# Patient Record
Sex: Female | Born: 2000 | Race: Black or African American | Hispanic: No | Marital: Single | Smoking: Current some day smoker
Health system: Southern US, Community
[De-identification: ages and names within clinical notes are randomized; demographics above are authoritative.]

## PROBLEM LIST (undated history)

## (undated) DIAGNOSIS — F419 Anxiety disorder, unspecified: Secondary | ICD-10-CM

## (undated) DIAGNOSIS — E119 Type 2 diabetes mellitus without complications: Secondary | ICD-10-CM

## (undated) DIAGNOSIS — D649 Anemia, unspecified: Secondary | ICD-10-CM

---
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
## 2021-05-04 ENCOUNTER — Other Ambulatory Visit: Payer: Self-pay

## 2021-05-04 ENCOUNTER — Encounter (HOSPITAL_COMMUNITY): Payer: Self-pay

## 2021-05-04 ENCOUNTER — Emergency Department (HOSPITAL_COMMUNITY)
Admission: EM | Admit: 2021-05-04 | Discharge: 2021-05-04 | Disposition: A | Discharge status: Home / Self Care | Payer: Medicaid Other | Attending: Emergency Medicine | Admitting: Emergency Medicine

## 2021-05-04 DIAGNOSIS — S60811A Abrasion of right wrist, initial encounter: Secondary | ICD-10-CM | POA: Insufficient documentation

## 2021-05-04 DIAGNOSIS — S61531A Puncture wound without foreign body of right wrist, initial encounter: Secondary | ICD-10-CM | POA: Insufficient documentation

## 2021-05-04 DIAGNOSIS — S6991XA Unspecified injury of right wrist, hand and finger(s), initial encounter: Secondary | ICD-10-CM | POA: Diagnosis present

## 2021-05-04 DIAGNOSIS — W540XXA Bitten by dog, initial encounter: Secondary | ICD-10-CM | POA: Insufficient documentation

## 2021-05-04 MED ORDER — AMOXICILLIN-POT CLAVULANATE 875-125 MG PO TABS: 1.0000 | ORAL_TABLET | Freq: Two times a day (BID) | ORAL | 0 refills | Status: DC

## 2021-05-04 MED ORDER — AMOXICILLIN-POT CLAVULANATE 875-125 MG PO TABS
1.0000 | ORAL_TABLET | Freq: Once | ORAL | Status: AC
  Administered 2021-05-04: 1 via ORAL
  Filled 2021-05-04: qty 1

## 2021-05-04 NOTE — Discharge Instructions (Signed)
Keep the wounds clean with antibacterial soap and water.  Keep the areas bandaged as needed until they heal.  Take the antibiotic as directed until finished.  Someone from animal control should contact you to take a report.  Return to the emergency department for any worsening symptoms or signs of infection.  Your wrist may be sore for several days, recommend you take ibuprofen every 6 hours as needed for pain.
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
## 2021-05-04 NOTE — ED Triage Notes (Signed)
Patient with complaints of dog bite to right wrist forearm area. Small abrasion noted no active bleeding. Pain only when touching the area. Family dog without shots per patient.

## 2021-05-04 NOTE — ED Notes (Signed)
Animal control contacted and given information.

## 2021-05-04 NOTE — ED Provider Notes (Signed)
[facility] EMERGENCY DEPARTMENT Provider Note   CSN: [id number] Arrival date & time: 05/04/21  5462     History  Chief Complaint  Patient presents with   Animal Bite    Marisa Long is a 21 y.o. female.   Animal Bite Associated symptoms: no numbness        Marisa Long is a 21 y.o. female who presents to the Emergency Department complaining of dog bite of her right wrist.  She states that she was involved in an argument with her mother and brother or owners of the dog.  States that her brother told the dog to attack her.  She states the dog grabbed her right wrist.  She complains of pain to her wrist with movement, and abrasions and scratches of her wrist.  The dog may have also scratched her left thigh, but she states there are no marks or open wounds of her leg.  Patient states the dog is not up-to-date on its rabies vaccinations, but states the dog stays inside the home.  She denies any pain or tingling of her fingers.  Believes her Td is up-to-date.    Home Medications Prior to Admission medications   Medication Sig Start Date End Date Taking? Authorizing Provider  acetaminophen (TYLENOL) 325 MG tablet Take 1-2 tablets (325-650 mg total) by mouth every 6 (six) hours as needed for mild pain (pain score 1-3 or temp > 100.5). Patient not taking: Reported on 05/04/2021 02/09/19   Derenda Fennel, PA  HYDROcodone-acetaminophen (NORCO/VICODIN) 5-325 MG tablet Take 1-2 tablets by mouth every 6 (six) hours as needed for severe pain. Patient not taking: Reported on 05/04/2021 02/09/19   Edmisten, Lyn Hollingshead, PA  methocarbamol (ROBAXIN) 500 MG tablet Take 1 tablet (500 mg total) by mouth every 6 (six) hours as needed for muscle spasms. Patient not taking: Reported on 05/04/2021 02/09/19   Derenda Fennel, PA      Allergies    Patient has no known allergies.    Review of Systems   Review of Systems  Musculoskeletal:  Negative for joint swelling.  Skin:  Positive for  wound.       Dog bite right wrist  Neurological:  Negative for weakness and numbness.  All other systems reviewed and are negative.  Physical Exam Updated Vital Signs BP 114/77 (BP Location: Left Arm)    Pulse 72    Temp 98.1 F (36.7 C) (Oral)    Resp 18    Ht 5\' 2"  (1.575 m)    Wt 64.9 kg    LMP 03/27/2021 (Approximate)    SpO2 100%    BMI 26.16 kg/m  Physical Exam Vitals and nursing note reviewed.  Constitutional:      General: She is not in acute distress.    Appearance: Normal appearance. She is not toxic-appearing.  Cardiovascular:     Rate and Rhythm: Normal rate and regular rhythm.     Pulses: Normal pulses.  Pulmonary:     Effort: Pulmonary effort is normal.     Breath sounds: Normal breath sounds.  Musculoskeletal:        General: Tenderness and signs of injury present.     Right wrist: Tenderness present. No swelling, bony tenderness or snuff box tenderness. Normal range of motion.     Comments: 1 superficial appearing puncture wound of the distal right wrist.  No active bleeding.  Several abrasions and scratches of the right wrist.  Patient has full range of motion of the  wrist and fingers of the right hand without difficulty.  Skin:    General: Skin is warm.     Capillary Refill: Capillary refill takes less than 2 seconds.  Neurological:     General: No focal deficit present.     Mental Status: She is alert.     Sensory: No sensory deficit.     Motor: No weakness.    ED Results / Procedures / Treatments   Labs (all labs ordered are listed, but only abnormal results are displayed) Labs Reviewed - No data to display  EKG None  Radiology No results found.  Procedures Procedures    Medications Ordered in ED Medications  amoxicillin-clavulanate (AUGMENTIN) 875-125 MG per tablet 1 tablet (has no administration in time range)    ED Course/ Medical Decision Making/ A&P                           Medical Decision Making Here for evaluation of dog bite of  the right wrist.  Occurred earlier this morning.  Patient reports dog owner provoked the attack.  Patient unsure of status of vaccinations.  Risk Prescription drug management.   Incident occurred in [place].  Animal control notified and will contact patient via telephone to take report.  dog reported to stay inside the home.  Questionable vaccination status. Mostly abrasions with one puncture that appears superficial   Here, wound cleaned and bandaged.  Td is up to date and initial dose of antibiotics provided.  Will provide prescription for antibiotics as well.  Dog reported to be within the home, so I do not feel that rabies vaccinations are indicated at this time as dog can be quarantined.  Patient agreeable to wound care precautions.        Final Clinical Impression(s) / ED Diagnoses Final diagnoses:  Dog bite, initial encounter    Rx / DC Orders ED Discharge Orders     None         Pauline Aus, PA-C 05/04/21 1210    Rozelle Logan, DO 05/04/21 1530

## 2021-12-22 ENCOUNTER — Inpatient Hospital Stay (HOSPITAL_COMMUNITY)
Admission: AD | Admit: 2021-12-22 | Discharge: 2021-12-22 | Disposition: A | Discharge status: Home / Self Care | Payer: Medicaid Other | Attending: Obstetrics & Gynecology | Admitting: Obstetrics & Gynecology

## 2021-12-22 ENCOUNTER — Inpatient Hospital Stay (HOSPITAL_COMMUNITY): Payer: Medicaid Other

## 2021-12-22 ENCOUNTER — Encounter (HOSPITAL_COMMUNITY): Payer: Self-pay

## 2021-12-22 DIAGNOSIS — O209 Hemorrhage in early pregnancy, unspecified: Secondary | ICD-10-CM | POA: Diagnosis present

## 2021-12-22 DIAGNOSIS — Z3A01 Less than 8 weeks gestation of pregnancy: Secondary | ICD-10-CM | POA: Insufficient documentation

## 2021-12-22 DIAGNOSIS — O4690 Antepartum hemorrhage, unspecified, unspecified trimester: Secondary | ICD-10-CM

## 2021-12-22 DIAGNOSIS — O3680X0 Pregnancy with inconclusive fetal viability, not applicable or unspecified: Secondary | ICD-10-CM | POA: Diagnosis not present

## 2021-12-22 LAB — WET PREP, GENITAL
Sperm: NONE SEEN
Trich, Wet Prep: NONE SEEN
WBC, Wet Prep HPF POC: <10 (ref <10)
Yeast Wet Prep HPF POC: NONE SEEN

## 2021-12-22 LAB — CBC
HCT: 34.3 % — ABNORMAL LOW (ref 36.0–46.0)
Hemoglobin: 11.6 g/dL — ABNORMAL LOW (ref 12.0–15.0)
MCH: 30.9 pg (ref 26.0–34.0)
MCHC: 33.8 g/dL (ref 30.0–36.0)
MCV: 91.2 fL (ref 80.0–100.0)
Platelets: 260 10*3/uL (ref 150–400)
RBC: 3.76 MIL/uL — ABNORMAL LOW (ref 3.87–5.11)
RDW: 13.1 % (ref 11.5–15.5)
WBC: 12.2 10*3/uL — ABNORMAL HIGH (ref 4.0–10.5)
nRBC: 0 % (ref 0.0–0.2)

## 2021-12-22 LAB — HCG, QUANTITATIVE, PREGNANCY: hCG, Beta Chain, Quant, S: 44474 m[IU]/mL — ABNORMAL HIGH (ref <5)

## 2021-12-22 LAB — ABO/RH: ABO/RH(D): A POS

## 2021-12-22 LAB — POCT PREGNANCY, URINE: Preg Test, Ur: POSITIVE — AB

## 2021-12-22 NOTE — Discharge Instructions (Signed)
[place] [facility] Providers   [facility]             [address], [place], [place] [address] [phone]  [facility]                                                             [address], [place], [place], [address] [phone]  [facility]                                    [address], [place], [place], [address] [phone]  [facility] [place] [address], [place], [place], [address] [phone]  [facility]                                 [address], [place], [place], [address] [phone]  [facility]                                    [address], [place], [place], [address] [phone]  [facility] [address], [place], [place], [address]                              [facility] [address], [place], [place], [address] [phone]  [facility]         Phone: [phone]  [facility]      Phone: [phone]   [facility]      Phone: [phone]  [facility]         Phone: [phone]   [facility]    Phone: [phone]  [facility]      Phone: [phone]  [facility]     Phone: [phone]  [facility]        Phone: [phone]  [facility] (Sheronette Cousins) [phone]  [facility]      Phone: [phone]   

## 2021-12-22 NOTE — MAU Note (Signed)
.  Marisa Long is a 21 y.o. at Unknown here in MAU reporting: VB spotting with wiping started about 1800. Pt report not wearing pad cause it is only when she went to the bathroom. Pt denies cramping and LOF. Pt denies recent intercourse for last 48 hours. Pt took two at home preg test that were pos  LMP: 11/08/2021  Onset of complaint: 1800 Pain score: 0/10 Vitals:   12/22/21 2021  BP: (!) 108/51  Pulse: 77  Resp: 18  SpO2: 100%      Lab orders placed from triage:  UA, UPT

## 2021-12-23 LAB — GC/CHLAMYDIA PROBE AMP (~~LOC~~) NOT AT ARMC
Chlamydia: NEGATIVE
Comment: NEGATIVE
Comment: NORMAL
Neisseria Gonorrhea: NEGATIVE

## 2021-12-23 NOTE — MAU Provider Note (Signed)
Chief Complaint: Vaginal Bleeding   None       SUBJECTIVE HPI: Marisa Long is a 21 y.o. G1P0 at [redacted]w[redacted]d by LMP who presents to maternity admissions reporting spotting with wipinig, no further bleeding.  No cramping. She denies vaginal itching/burning, urinary symptoms, h/a, dizziness, n/v, or fever/chills.     Vaginal Bleeding The patient's primary symptoms include vaginal bleeding. The patient's pertinent negatives include no genital itching or pelvic pain. The current episode started today. The patient is experiencing no pain. Pertinent negatives include no abdominal pain, back pain, chills, fever or headaches. The vaginal discharge was bloody. The vaginal bleeding is spotting. She has not been passing clots. She has not been passing tissue. Nothing aggravates the symptoms. She has tried nothing for the symptoms.   RN Note: Marisa Long is a 21 y.o. at Unknown here in MAU reporting: VB spotting with wiping started about 1800. Pt report not wearing pad cause it is only when she went to the bathroom. Pt denies cramping and LOF. Pt denies recent intercourse for last 48 hours. Pt took two at home preg test that were pos  LMP: 11/08/2021  No past medical history on file. Past Surgical History:  Procedure Laterality Date   TIBIA IM NAIL INSERTION Right 02/07/2019   Procedure: 1.  Excisional debridement of skin, subcutaneous tissue, and bone right tibia. 2.  Intramedullary fixation of right tibial shaft fracture. 3.  Primary closure of 22 mm traumatic wound and 90 mm traumatic wound. 4.  Application of negative pressure incisional dressing. 5.  Interpretation of fluoroscopic images.;  Surgeon: Samson Frederic, MD;  [facility];  Service: Orthopedics;  Late   Social History   Socioeconomic History   Marital status: Single    Spouse name: Not on file   Number of children: Not on file   Years of education: Not on file   Highest education level: Not on file  Occupational History   Not on  file  Tobacco Use   Smoking status: Passive Smoke Exposure - Never Smoker   Smokeless tobacco: Never  Substance and Sexual Activity   Alcohol use: No   Drug use: No   Sexual activity: Not on file  Other Topics Concern   Not on file  Social History Narrative   Not on file   Social Determinants of Health   Financial Resource Strain: Not on file  Food Insecurity: Not on file  Transportation Needs: Not on file  Physical Activity: Not on file  Stress: Not on file  Social Connections: Not on file  Intimate Partner Violence: Not on file   No current facility-administered medications on file prior to encounter.   No current outpatient medications on file prior to encounter.   No Known Allergies  I have reviewed patient's Past Medical Hx, Surgical Hx, Family Hx, Social Hx, medications and allergies.   ROS:  Review of Systems  Constitutional:  Negative for chills and fever.  Gastrointestinal:  Negative for abdominal pain.  Genitourinary:  Positive for vaginal bleeding. Negative for pelvic pain.  Musculoskeletal:  Negative for back pain.  Neurological:  Negative for headaches.   Review of Systems  Other systems negative   Physical Exam  Physical Exam No data found. Constitutional: Well-developed, well-nourished female in no acute distress.  Cardiovascular: normal rate Respiratory: normal effort GI: Abd soft, non-tender.  MS: Extremities nontender, no edema, normal ROM Neurologic: Alert and oriented x 4.  GU: Neg CVAT.  PELVIC EXAM: deferred in lieu of transvaginal  US  LAB RESULTS Results for orders placed or performed during the hospital encounter of 12/22/21 (from the past 48 hour(s))  Pregnancy, urine POC     Status: Abnormal   Collection Time: 12/22/21  8:07 PM  Result Value Ref Range   Preg Test, Ur POSITIVE (A) NEGATIVE    Comment:        THE SENSITIVITY OF THIS METHODOLOGY IS >24 mIU/mL   GC/Chlamydia probe amp ([place])not at [facility]     Status: None    Collection Time: 12/22/21  8:25 PM  Result Value Ref Range   Neisseria Gonorrhea Negative    Chlamydia Negative    Comment Normal Reference Ranger Chlamydia - Negative    Comment      Normal Reference Range Neisseria Gonorrhea - Negative  CBC     Status: Abnormal   Collection Time: 12/22/21  8:36 PM  Result Value Ref Range   WBC 12.2 (H) 4.0 - 10.5 K/uL   RBC 3.76 (L) 3.87 - 5.11 MIL/uL   Hemoglobin 11.6 (L) 12.0 - 15.0 g/dL   HCT 26.8 (L) 34.1 - 96.2 %   MCV 91.2 80.0 - 100.0 fL   MCH 30.9 26.0 - 34.0 pg   MCHC 33.8 30.0 - 36.0 g/dL   RDW 22.9 79.8 - 92.1 %   Platelets 260 150 - 400 K/uL   nRBC 0.0 0.0 - 0.2 %    Comment: Performed at [facility], [address] [place], [place] [address]  hCG, quantitative, pregnancy     Status: Abnormal   Collection Time: 12/22/21  8:36 PM  Result Value Ref Range   hCG, Beta Chain, Quant, S 44,474 (H) <5 mIU/mL    Comment:          GEST. AGE      CONC.  (mIU/mL)   <=1 WEEK        5 - 50     2 WEEKS       50 - 500     3 WEEKS       100 - 10,000     4 WEEKS     1,000 - 30,000     5 WEEKS     3,500 - 115,000   6-8 WEEKS     12,000 - 270,000    12 WEEKS     15,000 - 220,000        FEMALE AND NON-PREGNANT FEMALE:     LESS THAN 5 mIU/mL Performed at [facility], [address], [place], [place] [address]   ABO/Rh     Status: None   Collection Time: 12/22/21  8:36 PM  Result Value Ref Range   ABO/RH(D) A POS    No rh immune globuloin      NOT A RH IMMUNE GLOBULIN CANDIDATE, PT RH POSITIVE Performed at [facility], [address] [place], [place] [address]   Wet prep, genital     Status: Abnormal   Collection Time: 12/22/21  9:55 PM  Result Value Ref Range   Yeast Wet Prep HPF POC NONE SEEN NONE SEEN   Trich, Wet Prep NONE SEEN NONE SEEN   Clue Cells Wet Prep HPF POC PRESENT (A) NONE SEEN   WBC, Wet Prep HPF POC <10 <10   Sperm NONE SEEN     Comment: Performed at [facility], [address]  [place], [place] [address]    --/--/A POS (10/11 2036)  IMAGING US  OB LESS THAN 14 WEEKS WITH OB TRANSVAGINAL  Result Date: 12/22/2021 CLINICAL DATA:  First-trimester pregnancy. Spotting for 3 hours. Estimated gestational age by LMP is 6 weeks 2 days. Quantitative beta HCG is in progress. EXAM: OBSTETRIC <14 WK [place] AND TRANSVAGINAL OB US TECHNIQUE: Both transabdominal and transvaginal ultrasound examinations were performed for complete evaluation of the gestation as well as the maternal uterus, adnexal regions, and pelvic cul-de-sac. Transvaginal technique was performed to assess early pregnancy. COMPARISON:  None Available. FINDINGS: Intrauterine gestational sac: Single Yolk sac:  Visualized. Embryo:  Visualized. Cardiac Activity: Visualized. Heart Rate: 104 bpm CRL:  5.2 mm   6 w   1 d                  [place] EDC: 08/16/2022 Subchorionic hemorrhage:  None visualized. Maternal uterus/adnexae: Uterus is anteverted. No myometrial mass lesions are identified. Both ovaries are visualized although the right ovary is not well seen transvaginally. No abnormal adnexal masses. No free fluid. IMPRESSION: Single intrauterine pregnancy. Estimated gestational age by crown-rump length is 6 weeks 1 day. No acute complication is suggested sonographically. Electronically Signed   By: Lucienne Capers M.D.   On: 12/22/2021 21:55    MAU Management/MDM: I have reviewed the triage vital signs and the nursing notes.   Pertinent labs & imaging results that were available during my care of the patient were reviewed by me and considered in my medical decision making (see chart for details).      I have reviewed her medical records including past results, notes and treatments.   Ordered usual first trimester r/o ectopic labs.   Pelvic cultures done Will check baseline Ultrasound to rule out ectopic.  This bleeding/pain can represent a normal pregnancy with bleeding, spontaneous abortion or even an ectopic which can be  life-threatening.  The process as listed above helps to determine which of these is present.  Reviewed results.  Encouraged to start prenatal care  ASSESSMENT 1. Pregnancy of unknown anatomic location   2. [redacted] weeks gestation of pregnancy   3. Bleeding from genital tract during pregnancy     PLAN Discharge home Pelvic rest Encouraged to start prenatal care   [facility]. Schedule an appointment as soon as possible for a visit.   Specialty: Obstetrics and Gynecology Contact information: [place] [id number] [phone]               Pt stable at time of discharge. Encouraged to return here if she develops worsening of symptoms, increase in pain, fever, or other concerning symptoms.    Hansel Feinstein CNM, MSN Certified Nurse-Midwife 12/23/2021  9:22 PM

## 2021-12-24 ENCOUNTER — Other Ambulatory Visit: Payer: Self-pay

## 2021-12-24 ENCOUNTER — Inpatient Hospital Stay (HOSPITAL_COMMUNITY)
Admission: AD | Admit: 2021-12-24 | Discharge: 2021-12-24 | Disposition: A | Discharge status: Home / Self Care | Payer: Medicaid Other | Attending: Obstetrics & Gynecology | Admitting: Obstetrics & Gynecology

## 2021-12-24 DIAGNOSIS — O26859 Spotting complicating pregnancy, unspecified trimester: Secondary | ICD-10-CM

## 2021-12-24 DIAGNOSIS — O26851 Spotting complicating pregnancy, first trimester: Secondary | ICD-10-CM | POA: Diagnosis present

## 2021-12-24 DIAGNOSIS — Z3A01 Less than 8 weeks gestation of pregnancy: Secondary | ICD-10-CM

## 2021-12-24 LAB — URINALYSIS, ROUTINE W REFLEX MICROSCOPIC
Bilirubin Urine: NEGATIVE
Glucose, UA: NEGATIVE mg/dL
Ketones, ur: NEGATIVE mg/dL
Nitrite: NEGATIVE
Protein, ur: 30 mg/dL — AB
Specific Gravity, Urine: 1.027 (ref 1.005–1.030)
WBC, UA: >50 WBC/hpf — ABNORMAL HIGH (ref 0–5)
pH: 7 (ref 5.0–8.0)

## 2021-12-24 NOTE — MAU Provider Note (Signed)
History     CSN: [id number]  Arrival date and time: 12/24/21 1448   Event Date/Time   First Provider Initiated Contact with Patient 12/24/21 1600      Chief Complaint  Patient presents with   Vaginal Bleeding   Marisa Long is a 21 y.o. G1P0 at [redacted]w[redacted]d by Definite LMP of November 08, 2021.  She presents today for Vaginal Bleeding.  She reports today she noted blood with wiping.  She states it was "dark blood" and questions "is something going on."  Patient would like to "be sure." She states no blood was present upon arrival, with restroom usage. She denies recent sexual activity or vaginal discharge that is of concern.     OB History     Gravida  1   Para      Term      Preterm      AB      Living         SAB      IAB      Ectopic      Multiple      Live Births              No past medical history on file.  Past Surgical History:  Procedure Laterality Date   TIBIA IM NAIL INSERTION Right 02/07/2019   Procedure: 1.  Excisional debridement of skin, subcutaneous tissue, and bone right tibia. 2.  Intramedullary fixation of right tibial shaft fracture. 3.  Primary closure of 22 mm traumatic wound and 90 mm traumatic wound. 4.  Application of negative pressure incisional dressing. 5.  Interpretation of fluoroscopic images.;  Surgeon: Rod Can, MD;  Location: [facility];  Service: Orthopedics;  Late    No family history on file.  Social History   Tobacco Use   Smoking status: Passive Smoke Exposure - Never Smoker   Smokeless tobacco: Never  Substance Use Topics   Alcohol use: No   Drug use: No    Allergies: No Known Allergies  No medications prior to admission.    Review of Systems  Gastrointestinal:  Negative for constipation and diarrhea.  Genitourinary:  Positive for vaginal bleeding (None currently). Negative for difficulty urinating, dysuria and vaginal discharge.   Physical Exam   Blood pressure (!) 101/52, pulse 64, temperature 98 F  (36.7 C), temperature source Oral, resp. rate 19, height 5\' 2"  (1.575 m), weight 71 kg, last menstrual period 11/08/2021, SpO2 100 %.  Physical Exam Vitals reviewed.  Constitutional:      Appearance: Normal appearance.  HENT:     Head: Normocephalic and atraumatic.  Eyes:     Conjunctiva/sclera: Conjunctivae normal.  Cardiovascular:     Rate and Rhythm: Normal rate.  Pulmonary:     Effort: Pulmonary effort is normal. No respiratory distress.  Abdominal:     Palpations: Abdomen is soft.     Tenderness: There is no abdominal tenderness.  Musculoskeletal:        General: Normal range of motion.     Cervical back: Normal range of motion.  Skin:    General: Skin is warm and dry.  Neurological:     Mental Status: She is alert and oriented to person, place, and time.  Psychiatric:        Mood and Affect: Mood normal.        Behavior: Behavior normal.     MAU Course  Procedures Results for orders placed or performed during the hospital encounter of 12/24/21 (from the  past 24 hour(s))  Urinalysis, Routine w reflex microscopic Urine, Clean Catch     Status: Abnormal   Collection Time: 12/24/21  3:10 PM  Result Value Ref Range   Color, Urine YELLOW YELLOW   APPearance CLOUDY (A) CLEAR   Specific Gravity, Urine 1.027 1.005 - 1.030   pH 7.0 5.0 - 8.0   Glucose, UA NEGATIVE NEGATIVE mg/dL   Hgb urine dipstick SMALL (A) NEGATIVE   Bilirubin Urine NEGATIVE NEGATIVE   Ketones, ur NEGATIVE NEGATIVE mg/dL   Protein, ur 30 (A) NEGATIVE mg/dL   Nitrite NEGATIVE NEGATIVE   Leukocytes,Ua MODERATE (A) NEGATIVE   RBC / HPF 6-10 0 - 5 RBC/hpf   WBC, UA >50 (H) 0 - 5 WBC/hpf   Bacteria, UA FEW (A) NONE SEEN   Squamous Epithelial / LPF 11-20 0 - 5   Mucus PRESENT    Amorphous Crystal PRESENT    Patient informed that the ultrasound is considered a limited OB ultrasound and is not intended to be a complete ultrasound exam.  Patient also informed that the ultrasound is not being completed  with the intent of assessing for fetal or placental anomalies or any pelvic abnormalities.  Explained that the purpose of today's ultrasound is to assess for  viability.  Patient acknowledges the purpose of the exam and the limitations of the study.         MDM Reassurance BSUS Assessment and Plan  21 year old, G1P0  SIUP at 6.4 weeks Spotting-Resolved.   -Reviewed POC with patient. -Discussed vaginal bleeding vs spotting.  -Educated on spotting in pregnancy and what is of concern requiring further evaluation. -Reassured that some spotting in first trimester can be normal, but self-limiting. -Cautioned regarding risks of SAB in first trimester. -BSUS completed for reassurance and viability. Findings: SIUP with CRL of 6.3 weeks. FHR 107. YS identified.  -Patient informed of findings. -No questions or concerns. -Precautions given. -Encouraged to start prenatal care. List given. -Return to MAU if symptoms worsen or with the onset of new symptoms. -Discharged to home in stable condition.  Marisa Long 12/24/2021, 4:00 PM

## 2021-12-24 NOTE — Discharge Instructions (Signed)
  [place] [facility] Providers          [facility]  [address], [place], [place] [address]  [phone]  [facility]  [address], [place], [place] [address]  [phone]  [facility]  [address], [place], [place] [address]  [phone]  [facility] [place]  [address], [place], [place] [address]  [phone]  [facility]  [address]First floor), [place], [place] [address]  [phone]  [facility]  [address], [place], [place] [address]  [phone]  [facility]  [address], [place], [place] [address]  [phone]  [facility]  [address], [place], [place] [address]  [phone]  [facility]  [address], [place], [place] [address]  [phone]  [facility]  [address], [place], [place] [address]  [phone]  [facility]  [address], [place], [place] [address]  [phone]  [facility]   [address], [place], [place] [address]  [phone]  [facility]  [address], [place],  [address]   [phone]  [facility]  [address], [place],  [address]  [phone]         

## 2021-12-24 NOTE — MAU Note (Addendum)
Marisa Long is a 21 y.o. at [redacted]w[redacted]d here in MAU reporting: reporting she had dark red VB with wiping.  Denies any pain or recent intercourse. LMP: N/A Onset of complaint: today Pain score: 0 Vitals:   12/24/21 1505  BP: (!) 101/52  Pulse: 64  Resp: 19  Temp: 98 F (36.7 C)  SpO2: 100%     FHT:N/A Lab orders placed from triage:   UA

## 2021-12-26 LAB — CULTURE, OB URINE: Culture: 10000 — AB

## 2021-12-27 ENCOUNTER — Telehealth: Payer: Self-pay

## 2021-12-27 DIAGNOSIS — O2341 Unspecified infection of urinary tract in pregnancy, first trimester: Secondary | ICD-10-CM

## 2021-12-27 MED ORDER — NITROFURANTOIN MONOHYD MACRO 100 MG PO CAPS: 100.0000 mg | ORAL_CAPSULE | Freq: Two times a day (BID) | ORAL | 0 refills | Status: DC

## 2021-12-27 NOTE — Telephone Encounter (Signed)
Attempted to call patient, x 2, regarding positive UPT and treatment. Call straight to voicemail and no message left.  Prescription sent to pharmacy on file. Will send message to office to attempt to contact patient.

## 2022-01-04 ENCOUNTER — Telehealth: Payer: Self-pay | Admitting: *Deleted

## 2022-01-04 NOTE — Telephone Encounter (Signed)
TC to complete OB Intake. No answer. LVM. Sent MyChart message.

## 2022-01-24 ENCOUNTER — Inpatient Hospital Stay (HOSPITAL_COMMUNITY)
Admission: AD | Admit: 2022-01-24 | Discharge: 2022-01-24 | Disposition: A | Discharge status: Home / Self Care | Payer: Medicaid Other | Attending: Obstetrics & Gynecology | Admitting: Obstetrics & Gynecology

## 2022-01-24 DIAGNOSIS — O2341 Unspecified infection of urinary tract in pregnancy, first trimester: Secondary | ICD-10-CM | POA: Diagnosis not present

## 2022-01-24 DIAGNOSIS — O26891 Other specified pregnancy related conditions, first trimester: Secondary | ICD-10-CM | POA: Diagnosis present

## 2022-01-24 DIAGNOSIS — Z3A11 11 weeks gestation of pregnancy: Secondary | ICD-10-CM

## 2022-01-24 DIAGNOSIS — B9620 Unspecified Escherichia coli [E. coli] as the cause of diseases classified elsewhere: Secondary | ICD-10-CM | POA: Diagnosis not present

## 2022-01-24 DIAGNOSIS — R319 Hematuria, unspecified: Secondary | ICD-10-CM | POA: Diagnosis not present

## 2022-01-24 DIAGNOSIS — N390 Urinary tract infection, site not specified: Secondary | ICD-10-CM | POA: Insufficient documentation

## 2022-01-24 LAB — URINALYSIS, ROUTINE W REFLEX MICROSCOPIC
Bilirubin Urine: NEGATIVE
Glucose, UA: NEGATIVE mg/dL
Ketones, ur: NEGATIVE mg/dL
Leukocytes,Ua: NEGATIVE
Nitrite: NEGATIVE
Protein, ur: 30 mg/dL — AB
Specific Gravity, Urine: 1.003 — ABNORMAL LOW (ref 1.005–1.030)
pH: 7 (ref 5.0–8.0)

## 2022-01-24 MED ORDER — NITROFURANTOIN MONOHYD MACRO 100 MG PO CAPS: 100.0000 mg | ORAL_CAPSULE | Freq: Two times a day (BID) | ORAL | 0 refills | Status: DC

## 2022-01-24 NOTE — MAU Note (Addendum)
..  Marisa Long is a 21 y.o. at [redacted]w[redacted]d here in MAU reporting: woke up to pee and had bleeding. Is not sure if the blood is coming from her vagina or not but only saw the blood when she peed or wiped. Denies pain.  Never got UTI treatment, has been drinking only water and cranberry juice.  Last intercourse: a week ago Pain score: 0/10 Vitals:   01/24/22 0021  BP: (!) 118/55  Pulse: 76  Resp: 17  Temp: 98.2 F (36.8 C)  SpO2: 99%     FHT:160 Lab orders placed from triage: UA

## 2022-01-24 NOTE — MAU Provider Note (Signed)
History     CSN: [id number]  Arrival date and time: 01/24/22 0013   Event Date/Time   First Provider Initiated Contact with Patient 01/24/22 0035      No chief complaint on file.  Marisa Long is a 21 y.o. G1P0 at [redacted]w[redacted]d who receives care at [facility].  She presents today for hematuria.  She states she noted blood, with urination, about one hour ago.  She reports she is not experiencing any pain, but feels it is more than before. She states it is not vaginal and only occurs when she pees. She denies vaginal discharge.  Patient reports she did not pick up her prescription, from October, for a UTI.      OB History     Gravida  1   Para      Term      Preterm      AB      Living         SAB      IAB      Ectopic      Multiple      Live Births              No past medical history on file.  Past Surgical History:  Procedure Laterality Date   TIBIA IM NAIL INSERTION Right 02/07/2019   Procedure: 1.  Excisional debridement of skin, subcutaneous tissue, and bone right tibia. 2.  Intramedullary fixation of right tibial shaft fracture. 3.  Primary closure of 22 mm traumatic wound and 90 mm traumatic wound. 4.  Application of negative pressure incisional dressing. 5.  Interpretation of fluoroscopic images.;  Surgeon: Samson Frederic, MD;  Location: [facility] OR;  Service: Orthopedics;  Late    No family history on file.  Social History   Tobacco Use   Smoking status: Passive Smoke Exposure - Never Smoker   Smokeless tobacco: Never  Substance Use Topics   Alcohol use: No   Drug use: No    Allergies: No Known Allergies  Medications Prior to Admission  Medication Sig Dispense Refill Last Dose   nitrofurantoin, macrocrystal-monohydrate, (MACROBID) 100 MG capsule Take 1 capsule (100 mg total) by mouth 2 (two) times daily. 14 capsule 0     Review of Systems  Constitutional:  Negative for chills and fever.  Gastrointestinal:  Positive for nausea and vomiting.   Genitourinary:  Negative for difficulty urinating, dysuria, vaginal bleeding and vaginal discharge.       Reports urinary odor  Neurological:  Negative for headaches.   Physical Exam   Blood pressure (!) 118/55, pulse 76, temperature 98.2 F (36.8 C), temperature source Oral, resp. rate 17, height 5\' 2"  (1.575 m), weight 72.1 kg, last menstrual period 11/08/2021, SpO2 99 %.  Physical Exam Vitals reviewed.  Constitutional:      Appearance: Normal appearance.  HENT:     Head: Normocephalic and atraumatic.  Eyes:     Conjunctiva/sclera: Conjunctivae normal.  Cardiovascular:     Rate and Rhythm: Normal rate.  Pulmonary:     Effort: Pulmonary effort is normal. No respiratory distress.  Abdominal:     Palpations: Abdomen is soft.     Tenderness: There is no abdominal tenderness.  Musculoskeletal:        General: Normal range of motion.     Cervical back: Normal range of motion.  Skin:    General: Skin is warm and dry.  Neurological:     Mental Status: She is alert and oriented to person, place,  and time.  Psychiatric:        Mood and Affect: Mood normal.        Behavior: Behavior normal.     MAU Course  Procedures Results for orders placed or performed during the hospital encounter of 01/24/22 (from the past 24 hour(s))  Urinalysis, Routine w reflex microscopic Urine, Clean Catch     Status: Abnormal   Collection Time: 01/24/22 12:34 AM  Result Value Ref Range   Color, Urine PINKISH YELLOW   APPearance CLEAR CLEAR   Specific Gravity, Urine 1.003 (L) 1.005 - 1.030   pH 7.0 5.0 - 8.0   Glucose, UA NEGATIVE NEGATIVE mg/dL   Hgb urine dipstick MODERATE (A) NEGATIVE   Bilirubin Urine NEGATIVE NEGATIVE   Ketones, ur NEGATIVE NEGATIVE mg/dL   Protein, ur 30 (A) NEGATIVE mg/dL   Nitrite NEGATIVE NEGATIVE   Leukocytes,Ua NEGATIVE NEGATIVE   RBC / HPF 6-10 0 - 5 RBC/hpf   WBC, UA 0-5 0 - 5 WBC/hpf   Bacteria, UA RARE (A) NONE SEEN   Squamous Epithelial / LPF 0-5 0 - 5    Mucus PRESENT      MDM Exam BSUS UA Prescription Assessment and Plan  21 year old, G1P0  SIUP at 11 weeks Hematuria E. Coli UTI-Untreated  -Reviewed POC with patient. -Exam performed.  -UA collected and pending. Informed that will evaluate and if no remarkable changes, from previous UA, will continue plan as schedule.  -Rx resubmitted to pharmacy and patient request it be sent to 24 hr pharmacy. -Precautions reviewed. -Instructed to reschedule intake appt. -Encouraged to call primary office or return to MAU if symptoms worsen or with the onset of new symptoms. -Discharged to home in stable condition.  Cherre Robins 01/24/2022, 12:35 AM

## 2022-02-14 ENCOUNTER — Ambulatory Visit (INDEPENDENT_AMBULATORY_CARE_PROVIDER_SITE_OTHER): Payer: Medicaid Other

## 2022-02-14 VITALS — BP 125/60 | HR 70 | Ht 62.0 in | Wt 164.4 lb

## 2022-02-14 DIAGNOSIS — Z3401 Encounter for supervision of normal first pregnancy, first trimester: Secondary | ICD-10-CM | POA: Insufficient documentation

## 2022-02-14 DIAGNOSIS — Z1332 Encounter for screening for maternal depression: Secondary | ICD-10-CM | POA: Diagnosis not present

## 2022-02-14 MED ORDER — VITAFOL GUMMIES 3.33-0.333-34.8 MG PO CHEW: 3.0000 | CHEWABLE_TABLET | Freq: Every day | ORAL | 11 refills | Status: DC

## 2022-02-14 MED ORDER — GOJJI WEIGHT SCALE MISC: 1.0000 | 0 refills | Status: DC

## 2022-02-14 MED ORDER — BLOOD PRESSURE KIT DEVI: 1.0000 | 0 refills | Status: DC

## 2022-02-14 NOTE — Progress Notes (Signed)
New OB Intake  I connected with Marisa Long on 02/14/22 at  2:10 PM EST by in person and verified that I am speaking with the correct person using two identifiers. Nurse is located at [facility] and pt is located at [place].  I discussed the limitations, risks, security and privacy concerns of performing an evaluation and management service by telephone and the availability of in person appointments. I also discussed with the patient that there may be a patient responsible charge related to this service. The patient expressed understanding and agreed to proceed.  I explained I am completing New OB Intake today. We discussed EDD of 08/15/22 that is based on LMP of 11/08/21. Pt is G1/P0. I reviewed her allergies, medications, Medical/Surgical/OB history, and appropriate screenings. I informed her of [facility] services. [facility] information placed in AVS. Based on history, this is a low risk pregnancy.  Patient Active Problem List   Diagnosis Date Noted   Status post fracture of tibia 02/07/2019   Open fracture of tibia and fibula, shaft, right, type I or II, initial encounter 02/07/2019    Concerns addressed today  Delivery Plans Plans to deliver at [facility]. Patient given information for [facility] website for more information about [facility]. Patient is not interested in water birth. Offered upcoming OB visit with CNM to discuss further.  MyChart/Babyscripts MyChart access verified. I explained pt will have some visits in office and some virtually. Babyscripts instructions given and order placed. Patient verifies receipt of registration text/e-mail. Account successfully created and app downloaded.  Blood Pressure Cuff/Weight Scale Blood pressure cuff ordered for patient to pick-up from [employer]. Explained after first prenatal appt pt will check weekly and document in Babyscripts. Patient does not have weight scale; order sent to [facility], patient may track weight  weekly in Babyscripts.  Anatomy US Explained first scheduled [place] will be around 19 weeks. Anatomy US ordered. Date TBD.   Labs Discussed Avelina Laine genetic screening with patient. Would like both Panorama and Horizon drawn at new OB visit. Routine prenatal labs needed.  COVID Vaccine Patient has not had COVID vaccine.   Is patient a CenteringPregnancy candidate?  Declined Declined due to Group setting Not a candidate due to  If accepted,    Social Determinants of Health Food Insecurity: Patient denies food insecurity. WIC Referral: Patient is interested in referral to [facility].  Transportation: Patient denies transportation needs. Childcare: Discussed no children allowed at ultrasound appointments. Offered childcare services; patient declines childcare services at this time.  First visit review I reviewed new OB appt with patient. I explained they will have a provider visit that includes prenatal labs, std screening, genetic screening, and discuss plan of care for pregnancy. Explained pt will be seen by Dr. Ladon Applebaum at first visit; encounter routed to appropriate provider. Explained that patient will be seen by pregnancy navigator following visit with provider.   Hamilton Capri, RN 02/14/2022  2:48 PM

## 2022-02-15 ENCOUNTER — Encounter (HOSPITAL_COMMUNITY): Payer: Self-pay | Admitting: Obstetrics & Gynecology

## 2022-02-15 ENCOUNTER — Inpatient Hospital Stay (HOSPITAL_COMMUNITY)
Admission: AD | Admit: 2022-02-15 | Discharge: 2022-02-15 | Disposition: A | Discharge status: Home / Self Care | Payer: Medicaid Other | Attending: Obstetrics & Gynecology | Admitting: Obstetrics & Gynecology

## 2022-02-15 ENCOUNTER — Other Ambulatory Visit: Payer: Self-pay

## 2022-02-15 DIAGNOSIS — Z3A14 14 weeks gestation of pregnancy: Secondary | ICD-10-CM | POA: Diagnosis not present

## 2022-02-15 DIAGNOSIS — Z87898 Personal history of other specified conditions: Secondary | ICD-10-CM

## 2022-02-15 DIAGNOSIS — O209 Hemorrhage in early pregnancy, unspecified: Secondary | ICD-10-CM | POA: Diagnosis present

## 2022-02-15 HISTORY — DX: Anxiety disorder, unspecified: F41.9
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
## 2022-02-15 NOTE — Discharge Instructions (Signed)
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
## 2022-02-28 ENCOUNTER — Ambulatory Visit (INDEPENDENT_AMBULATORY_CARE_PROVIDER_SITE_OTHER): Payer: Medicaid Other | Admitting: Family Medicine

## 2022-02-28 ENCOUNTER — Encounter: Payer: Self-pay | Admitting: Family Medicine

## 2022-02-28 ENCOUNTER — Other Ambulatory Visit (HOSPITAL_COMMUNITY)
Admission: RE | Admit: 2022-02-28 | Discharge: 2022-02-28 | Disposition: A | Discharge status: Home / Self Care | Payer: Medicaid Other | Source: Ambulatory Visit | Attending: Family Medicine | Admitting: Family Medicine

## 2022-02-28 VITALS — BP 104/61 | HR 86 | Wt 161.0 lb

## 2022-02-28 DIAGNOSIS — Z3A16 16 weeks gestation of pregnancy: Secondary | ICD-10-CM | POA: Diagnosis not present

## 2022-02-28 DIAGNOSIS — Z3401 Encounter for supervision of normal first pregnancy, first trimester: Secondary | ICD-10-CM | POA: Diagnosis present

## 2022-02-28 DIAGNOSIS — Z3402 Encounter for supervision of normal first pregnancy, second trimester: Secondary | ICD-10-CM

## 2022-02-28 MED ORDER — ASPIRIN 81 MG PO TBEC: 81.0000 mg | DELAYED_RELEASE_TABLET | Freq: Every day | ORAL | 6 refills | Status: DC

## 2022-02-28 NOTE — Progress Notes (Signed)
Patient presents for new OB visit. No concerns at this time.  
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
## 2022-03-02 LAB — CBC/D/PLT+RPR+RH+ABO+RUBIGG...
Antibody Screen: NEGATIVE
Basophils Absolute: 0 10*3/uL (ref 0.0–0.2)
Basos: 0 %
EOS (ABSOLUTE): 0.1 10*3/uL (ref 0.0–0.4)
Eos: 1 %
HCV Ab: NONREACTIVE
HIV Screen 4th Generation wRfx: NONREACTIVE
Hematocrit: 37.4 % (ref 34.0–46.6)
Hemoglobin: 12.2 g/dL (ref 11.1–15.9)
Hepatitis B Surface Ag: NEGATIVE
Immature Grans (Abs): 0.1 10*3/uL (ref 0.0–0.1)
Immature Granulocytes: 1 %
Lymphocytes Absolute: 1.5 10*3/uL (ref 0.7–3.1)
Lymphs: 12 %
MCH: 30.3 pg (ref 26.6–33.0)
MCHC: 32.6 g/dL (ref 31.5–35.7)
MCV: 93 fL (ref 79–97)
Monocytes Absolute: 0.6 10*3/uL (ref 0.1–0.9)
Monocytes: 5 %
Neutrophils Absolute: 10.1 10*3/uL — ABNORMAL HIGH (ref 1.4–7.0)
Neutrophils: 81 %
Platelets: 198 10*3/uL (ref 150–450)
RBC: 4.02 x10E6/uL (ref 3.77–5.28)
RDW: 12.8 % (ref 11.7–15.4)
RPR Ser Ql: NONREACTIVE
Rh Factor: POSITIVE
Rubella Antibodies, IGG: 1.03 index (ref >0.99)
WBC: 12.3 10*3/uL — ABNORMAL HIGH (ref 3.4–10.8)

## 2022-03-02 LAB — AFP, SERUM, OPEN SPINA BIFIDA
AFP MoM: 1.85
AFP Value: 65.7 ng/mL
Gest. Age on Collection Date: 16 weeks
Maternal Age At EDD: 21.4 yr
OSBR Risk 1 IN: 2270
Test Results:: NEGATIVE
Weight: 161 [lb_av]

## 2022-03-02 LAB — CERVICOVAGINAL ANCILLARY ONLY
Chlamydia: NEGATIVE
Comment: NEGATIVE
Comment: NEGATIVE
Comment: NORMAL
Neisseria Gonorrhea: NEGATIVE
Trichomonas: POSITIVE — AB

## 2022-03-02 LAB — URINE CULTURE, OB REFLEX

## 2022-03-02 LAB — CULTURE, OB URINE

## 2022-03-02 LAB — HCV INTERPRETATION

## 2022-03-04 ENCOUNTER — Other Ambulatory Visit: Payer: Self-pay | Admitting: Family Medicine

## 2022-03-04 DIAGNOSIS — A5901 Trichomonal vulvovaginitis: Secondary | ICD-10-CM

## 2022-03-04 MED ORDER — METRONIDAZOLE 500 MG PO TABS: 500.0000 mg | ORAL_TABLET | Freq: Two times a day (BID) | ORAL | 1 refills | Status: AC

## 2022-03-09 ENCOUNTER — Telehealth: Payer: Self-pay | Admitting: Emergency Medicine

## 2022-03-09 NOTE — Telephone Encounter (Signed)
Attempted TC to patient. Unable to LVM.  

## 2022-03-09 NOTE — Telephone Encounter (Signed)
-----   Message from Alfredia Ferguson, MD sent at 03/04/2022 12:47 PM EST ----- Prenatal labs came back normal so far. She does have Trichomonas infection. I have sent a prescription of metronidazole for patient and for her partner.

## 2022-03-11 ENCOUNTER — Encounter: Payer: Self-pay | Admitting: Family Medicine

## 2022-03-11 ENCOUNTER — Telehealth: Payer: Self-pay | Admitting: Emergency Medicine

## 2022-03-11 NOTE — Telephone Encounter (Signed)
Attempted TC to patient. Unable to LVM.

## 2022-03-14 NOTE — L&D Delivery Note (Addendum)
Operative Delivery Note CTBS for worsening variables with pushing. Pt rolled to left side. No improvement in FHR, difficult to trace. FSE replaced since it had fallen off. FHR deceling to 60-70's. Dr. Alvester Morin called for possible vacuum. NICU called for delivery due to variables and possible vacuum.   Pt pushed well with assistance of kiwi vacuum by Dr. Alvester Morin. At 4:59 PM a healthy female was delivered via Vaginal, Vacuum Investment banker, operational).  Presentation: vertex; Position: Left, occiput posterior; Station: +2. Vacuum was decided after baby experienced multiple variable decelerations in the 70's (baseline around 110-120). NICU team was brought to the room, but baby did not require intervention.  Verbal consent: obtained from patient by Dr. Alvester Morin.  Risks and benefits discussed in detail.  Risks include, but are not limited to the risks of anesthesia, bleeding, infection, damage to maternal tissues, fetal cephalhematoma. There is also the risk of inability to effect vaginal delivery of the head, or shoulder dystocia that cannot be resolved by established maneuvers, leading to the need for emergency cesarean section.  Shoulders delivered easily. Infant placed skin-to-skin w/ mom. Delayed cord clamping x 1 minute. Cord clamped x 2 and cut by FOB. Pitocin bolus started after delivery of baby. APGAR: 8, 9; weight 2580g.  Placenta status: Spontaneous, fully intact, no missing products. Cord: 3 vessel cord with the following complications: None.  Cord pH: N/A  Anesthesia: Epidural Instruments: Vacuum Extractor  Episiotomy: None Lacerations: Superficial labial. Hemostatic, no repair indicated. Suture Repair: None Est. Blood Loss (mL): 125  Mom to postpartum.  Baby to Couplet care / Skin to Skin. Placenta to: LD Feeding: Formula Circ: IP Contraception: Undecided  [place], CNM 07/21/2022 7:21 PM

## 2022-03-15 ENCOUNTER — Inpatient Hospital Stay (HOSPITAL_COMMUNITY): Payer: Medicaid Other

## 2022-03-15 ENCOUNTER — Inpatient Hospital Stay (HOSPITAL_COMMUNITY)
Admission: AD | Admit: 2022-03-15 | Discharge: 2022-03-15 | Disposition: A | Discharge status: Home / Self Care | Payer: Medicaid Other | Attending: Obstetrics and Gynecology | Admitting: Obstetrics and Gynecology

## 2022-03-15 ENCOUNTER — Encounter (HOSPITAL_COMMUNITY): Payer: Self-pay | Admitting: Obstetrics and Gynecology

## 2022-03-15 DIAGNOSIS — Z3401 Encounter for supervision of normal first pregnancy, first trimester: Secondary | ICD-10-CM

## 2022-03-15 DIAGNOSIS — Z3A18 18 weeks gestation of pregnancy: Secondary | ICD-10-CM | POA: Diagnosis not present

## 2022-03-15 DIAGNOSIS — M79643 Pain in unspecified hand: Secondary | ICD-10-CM | POA: Diagnosis not present

## 2022-03-15 DIAGNOSIS — M7989 Other specified soft tissue disorders: Secondary | ICD-10-CM | POA: Diagnosis not present

## 2022-03-15 DIAGNOSIS — M25532 Pain in left wrist: Secondary | ICD-10-CM | POA: Insufficient documentation

## 2022-03-15 DIAGNOSIS — S6991XA Unspecified injury of right wrist, hand and finger(s), initial encounter: Secondary | ICD-10-CM | POA: Diagnosis not present

## 2022-03-15 DIAGNOSIS — R1032 Left lower quadrant pain: Secondary | ICD-10-CM | POA: Diagnosis present

## 2022-03-15 DIAGNOSIS — W19XXXA Unspecified fall, initial encounter: Secondary | ICD-10-CM

## 2022-03-15 DIAGNOSIS — O26899 Other specified pregnancy related conditions, unspecified trimester: Secondary | ICD-10-CM
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
## 2022-03-15 NOTE — MAU Provider Note (Signed)
History     CSN: [id number]  Arrival date and time: 03/15/22 1009   Event Date/Time   First Provider Initiated Contact with Patient 03/15/22 1029      Chief Complaint  Patient presents with   Abdominal Pain   Fall   Hand Pain   Patient was moving a tall dresser through a door way this morning about 8/9 AM and reports she fell forward onto her abdomen and onto her left wrist. She also scraped her nose on the corner of the dresser.  Abdominal pain: Reports this started after the fall against the dresser. It is located in the LLQ and feels like burning, piercing sensation that radiates up in a curvilinear fashion. She reports about 10-11 times in a hour but has been improving. The pain will get better when she rubs that spot.   Wrist pain: She fell onto her left wrist in a deeply flexed position and felt a pop and reports pain on the dorsal aspect of hand and wrist with swelling at the wrist on the lateral side. Noted bruising. Patient denies leaking fluid and bleeding. Reports she has not felt fetal movement so far in this pregnancy.   Abdominal Pain This is a new problem. The current episode started today. The onset quality is sudden. The problem occurs 2 to 4 times per day. The most recent episode lasted 2 hours. The problem has been gradually improving since onset. The pain is located in the LLQ. The pain is at a severity of 3/10. The pain is mild. The quality of the pain is described as burning. Pertinent negatives include no diarrhea, dysuria, fever, nausea, rash, sore throat or vomiting.  Fall Associated symptoms include abdominal pain. Pertinent negatives include no fever, nausea or vomiting.  Hand Pain  Pertinent negatives include no chest pain.    OB History     Gravida  1   Para      Term      Preterm      AB      Living         SAB      IAB      Ectopic      Multiple      Live Births              Past Medical History:  Diagnosis Date   Anxiety      Past Surgical History:  Procedure Laterality Date   TIBIA IM NAIL INSERTION Right 02/07/2019   Procedure: 1.  Excisional debridement of skin, subcutaneous tissue, and bone right tibia. 2.  Intramedullary fixation of right tibial shaft fracture. 3.  Primary closure of 22 mm traumatic wound and 90 mm traumatic wound. 4.  Application of negative pressure incisional dressing. 5.  Interpretation of fluoroscopic images.;  Surgeon: Rod Can, MD;  Location: [facility];  Service: Orthopedics;  Late    Family History  Problem Relation Age of Onset   Hypertension Mother     Social History   Tobacco Use   Smoking status: Former    Years: 11.00    Types: Cigarettes    Passive exposure: Yes   Smokeless tobacco: Never  Vaping Use   Vaping Use: Former  Substance Use Topics   Alcohol use: No   Drug use: Yes    Types: Marijuana    Comment: Edibles    Allergies: No Known Allergies  Medications Prior to Admission  Medication Sig Dispense Refill Last Dose   aspirin EC 81  MG tablet Take 1 tablet (81 mg total) by mouth daily. Swallow whole. 30 tablet 6    Blood Pressure Monitoring (BLOOD PRESSURE KIT) DEVI 1 kit by Does not apply route once a week. 1 each 0    Misc. Devices (GOJJI WEIGHT SCALE) MISC 1 Device by Does not apply route every 30 (thirty) days. 1 each 0    Prenatal Vit-Fe Phos-FA-Omega (VITAFOL GUMMIES) 3.33-0.333-34.8 MG CHEW Chew 3 tablets by mouth daily. 90 tablet 11     Review of Systems  Constitutional:  Negative for chills and fever.  HENT:  Negative for congestion and sore throat.   Eyes:  Negative for pain and visual disturbance.  Respiratory:  Negative for cough, chest tightness and shortness of breath.   Cardiovascular:  Negative for chest pain.  Gastrointestinal:  Positive for abdominal pain. Negative for diarrhea, nausea and vomiting.  Endocrine: Negative for cold intolerance and heat intolerance.  Genitourinary:  Negative for dysuria and flank pain.   Musculoskeletal:  Positive for joint swelling (right wrist). Negative for back pain.  Skin:  Negative for rash.       +bruise on right hand 2cm laceration over the bridge of nose  Allergic/Immunologic: Negative for food allergies.  Neurological:  Negative for dizziness and light-headedness.  Hematological:  Does not bruise/bleed easily.  Psychiatric/Behavioral:  Negative for agitation.    Physical Exam   Blood pressure 111/76, pulse 84, temperature 98.6 F (37 C), temperature source Oral, resp. rate 14, weight 74.6 kg, last menstrual period 11/08/2021, SpO2 100 %.  Physical Exam Vitals and nursing note reviewed.  Constitutional:      General: She is not in acute distress.    Appearance: She is well-developed.     Comments: Pregnant female  HENT:     Head: Normocephalic and atraumatic.   Eyes:     General: No scleral icterus.    Conjunctiva/sclera: Conjunctivae normal.  Cardiovascular:     Rate and Rhythm: Normal rate.  Pulmonary:     Effort: Pulmonary effort is normal.  Chest:     Chest wall: No tenderness.  Abdominal:     General: Bowel sounds are normal.     Palpations: Abdomen is soft.     Tenderness: There is abdominal tenderness (mild to palpation, pinpoint, no located over the uterus.) in the left lower quadrant. There is no guarding or rebound.     Comments: Gravid below umbilicus  Genitourinary:    Vagina: Normal.  Musculoskeletal:        General: Normal range of motion.       Hands:     Cervical back: Normal range of motion and neck supple.  Skin:    General: Skin is warm and dry.     Findings: No rash.  Neurological:     Mental Status: She is alert and oriented to person, place, and time.     MAU Course  Procedures  MDM Moderate  11:36 AM patient needed to leave, images of wrist are not officially read. I reviewed them in detail and did not see a large fracture of displaced fracture. Will write patient on mychart or call if she needs to casting  which is unlikely  Assessment and Plan   #Abdominal pain: Likely MSK related due to sudden movement/stretch of peripheral nerve as the pain is reproducible on exam. Reviewed use of tylenol for pain and heat/ice to the area  #Wrist pain: obtained XR to r/o fracture given point TTP over the proximal metacarpal bone.  I personally reviewed the images prior to the patient leaving and did not see any fractures. Recommended tylenol and ice to area  #Nasal bridge abrasion: recommended aquaphor/coconut oil to help with scarring.   Fetal well being, No concerns for abruption as this was low velocity and FHR was WNL with no bleeding reported. Given precautions to return for LOF or bleeding.   Juanita Craver [facility] 03/15/2022, 10:29 AM

## 2022-03-16 ENCOUNTER — Encounter: Payer: Self-pay | Admitting: Emergency Medicine

## 2022-03-21 LAB — PANORAMA PRENATAL TEST FULL PANEL:PANORAMA TEST PLUS 5 ADDITIONAL MICRODELETIONS: FETAL FRACTION: 6.2

## 2022-03-21 LAB — HORIZON CUSTOM: REPORT SUMMARY: NEGATIVE

## 2022-03-22 ENCOUNTER — Ambulatory Visit: Payer: Medicaid Other | Attending: Maternal & Fetal Medicine

## 2022-03-22 DIAGNOSIS — Z3401 Encounter for supervision of normal first pregnancy, first trimester: Secondary | ICD-10-CM | POA: Diagnosis not present

## 2022-03-22 DIAGNOSIS — Z3A19 19 weeks gestation of pregnancy: Secondary | ICD-10-CM | POA: Insufficient documentation

## 2022-03-22 DIAGNOSIS — Z363 Encounter for antenatal screening for malformations: Secondary | ICD-10-CM | POA: Diagnosis not present

## 2022-03-22 DIAGNOSIS — O321XX0 Maternal care for breech presentation, not applicable or unspecified: Secondary | ICD-10-CM | POA: Diagnosis not present

## 2022-03-22 NOTE — Progress Notes (Signed)
TC X 2 numbers. "Home" number out of service. LVM on mobile number with request for call back and advised of MyChart message being sent.

## 2022-03-23 ENCOUNTER — Other Ambulatory Visit: Payer: Self-pay | Admitting: *Deleted

## 2022-03-23 DIAGNOSIS — Z362 Encounter for other antenatal screening follow-up: Secondary | ICD-10-CM

## 2022-03-28 ENCOUNTER — Encounter: Payer: Medicaid Other | Admitting: Obstetrics and Gynecology

## 2022-03-28 ENCOUNTER — Inpatient Hospital Stay (HOSPITAL_COMMUNITY)
Admission: AD | Admit: 2022-03-28 | Discharge: 2022-03-28 | Discharge status: Against Medical Advice | Payer: Medicaid Other | Attending: Family Medicine | Admitting: Family Medicine

## 2022-03-28 NOTE — MAU Note (Signed)
RN called pt x2 more after initial. Pt not in lobby. MAU registration states pt walked out.

## 2022-03-28 NOTE — MAU Note (Signed)
RN went to call pt back. Pt not in lobby.

## 2022-03-30 ENCOUNTER — Encounter: Payer: Self-pay | Admitting: Obstetrics

## 2022-03-30 ENCOUNTER — Ambulatory Visit (INDEPENDENT_AMBULATORY_CARE_PROVIDER_SITE_OTHER): Payer: Medicaid Other | Admitting: Obstetrics

## 2022-03-30 ENCOUNTER — Other Ambulatory Visit (HOSPITAL_COMMUNITY)
Admission: RE | Admit: 2022-03-30 | Discharge: 2022-03-30 | Disposition: A | Discharge status: Home / Self Care | Payer: Medicaid Other | Source: Ambulatory Visit | Attending: Obstetrics and Gynecology | Admitting: Obstetrics and Gynecology

## 2022-03-30 VITALS — BP 114/62 | HR 82 | Wt 166.3 lb

## 2022-03-30 DIAGNOSIS — A599 Trichomoniasis, unspecified: Secondary | ICD-10-CM

## 2022-03-30 DIAGNOSIS — A5901 Trichomonal vulvovaginitis: Secondary | ICD-10-CM | POA: Insufficient documentation

## 2022-03-30 DIAGNOSIS — Z87898 Personal history of other specified conditions: Secondary | ICD-10-CM

## 2022-03-30 DIAGNOSIS — Z3A20 20 weeks gestation of pregnancy: Secondary | ICD-10-CM

## 2022-03-30 DIAGNOSIS — Z3402 Encounter for supervision of normal first pregnancy, second trimester: Secondary | ICD-10-CM

## 2022-03-30 DIAGNOSIS — J4520 Mild intermittent asthma, uncomplicated: Secondary | ICD-10-CM

## 2022-03-30 DIAGNOSIS — Z3401 Encounter for supervision of normal first pregnancy, first trimester: Secondary | ICD-10-CM

## 2022-03-30 MED ORDER — METRONIDAZOLE 500 MG PO TABS: 500.0000 mg | ORAL_TABLET | Freq: Two times a day (BID) | ORAL | 0 refills | Status: DC

## 2022-03-30 MED ORDER — ALBUTEROL SULFATE (2.5 MG/3ML) 0.083% IN NEBU: 2.5000 mg | INHALATION_SOLUTION | Freq: Four times a day (QID) | RESPIRATORY_TRACT | 3 refills | Status: DC | PRN

## 2022-03-30 NOTE — Progress Notes (Signed)
Pt c/o tingling in the left foot the duration of her pregnancy and back pain. Pt states she lost Metronidazole while moving, did not complete treatment. Pt requesting Rx for inhaler due to asthma flare ups. Pt states anxiety has been severe lately, requesting behavioral health consult.
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
## 2022-04-01 LAB — CERVICOVAGINAL ANCILLARY ONLY
Chlamydia: NEGATIVE
Comment: NEGATIVE
Comment: NEGATIVE
Comment: NORMAL
Neisseria Gonorrhea: NEGATIVE
Trichomonas: NEGATIVE

## 2022-04-05 ENCOUNTER — Other Ambulatory Visit: Payer: Self-pay | Admitting: Emergency Medicine

## 2022-04-05 MED ORDER — ALBUTEROL SULFATE HFA 108 (90 BASE) MCG/ACT IN AERS: 2.0000 | INHALATION_SPRAY | Freq: Four times a day (QID) | RESPIRATORY_TRACT | 2 refills | Status: DC | PRN

## 2022-04-05 NOTE — Progress Notes (Signed)
Order change to albuterol inhaler- per Jodi Mourning, MD.

## 2022-04-16 ENCOUNTER — Encounter (HOSPITAL_COMMUNITY): Payer: Self-pay | Admitting: Obstetrics and Gynecology

## 2022-04-16 ENCOUNTER — Inpatient Hospital Stay (HOSPITAL_COMMUNITY)
Admission: AD | Admit: 2022-04-16 | Discharge: 2022-04-16 | Disposition: A | Discharge status: Home / Self Care | Payer: Medicaid Other | Attending: Obstetrics and Gynecology | Admitting: Obstetrics and Gynecology

## 2022-04-16 ENCOUNTER — Other Ambulatory Visit: Payer: Self-pay

## 2022-04-16 ENCOUNTER — Inpatient Hospital Stay (HOSPITAL_BASED_OUTPATIENT_CLINIC_OR_DEPARTMENT_OTHER): Payer: Medicaid Other

## 2022-04-16 DIAGNOSIS — B3731 Acute candidiasis of vulva and vagina: Secondary | ICD-10-CM

## 2022-04-16 DIAGNOSIS — Z630 Problems in relationship with spouse or partner: Secondary | ICD-10-CM | POA: Insufficient documentation

## 2022-04-16 DIAGNOSIS — M545 Low back pain, unspecified: Secondary | ICD-10-CM | POA: Insufficient documentation

## 2022-04-16 DIAGNOSIS — O9A212 Injury, poisoning and certain other consequences of external causes complicating pregnancy, second trimester: Secondary | ICD-10-CM

## 2022-04-16 DIAGNOSIS — W108XXA Fall (on) (from) other stairs and steps, initial encounter: Secondary | ICD-10-CM | POA: Diagnosis not present

## 2022-04-16 DIAGNOSIS — W109XXA Fall (on) (from) unspecified stairs and steps, initial encounter: Secondary | ICD-10-CM

## 2022-04-16 DIAGNOSIS — Z3A22 22 weeks gestation of pregnancy: Secondary | ICD-10-CM | POA: Diagnosis not present

## 2022-04-16 DIAGNOSIS — O23592 Infection of other part of genital tract in pregnancy, second trimester: Secondary | ICD-10-CM | POA: Diagnosis not present

## 2022-04-16 DIAGNOSIS — O26892 Other specified pregnancy related conditions, second trimester: Secondary | ICD-10-CM | POA: Diagnosis not present

## 2022-04-16 DIAGNOSIS — R109 Unspecified abdominal pain: Secondary | ICD-10-CM | POA: Insufficient documentation

## 2022-04-16 LAB — URINALYSIS, ROUTINE W REFLEX MICROSCOPIC
Bacteria, UA: NONE SEEN
Bilirubin Urine: NEGATIVE
Glucose, UA: NEGATIVE mg/dL
Hgb urine dipstick: NEGATIVE
Ketones, ur: 80 mg/dL — AB
Nitrite: NEGATIVE
Protein, ur: NEGATIVE mg/dL
Specific Gravity, Urine: 1.02 (ref 1.005–1.030)
pH: 6 (ref 5.0–8.0)

## 2022-04-16 LAB — POCT FERN TEST: POCT Fern Test: NEGATIVE

## 2022-04-16 MED ORDER — ACETAMINOPHEN 160 MG/5ML PO SOLN
650.0000 mg | Freq: Once | ORAL | Status: AC
  Administered 2022-04-16: 650 mg via ORAL
  Filled 2022-04-16: qty 20.3

## 2022-04-16 MED ORDER — LACTATED RINGERS IV BOLUS
1000.0000 mL | Freq: Once | INTRAVENOUS | Status: AC
  Administered 2022-04-16: 1000 mL via INTRAVENOUS

## 2022-04-16 MED ORDER — TERCONAZOLE 0.4 % VA CREA: 1.0000 | TOPICAL_CREAM | Freq: Every day | VAGINAL | 0 refills | Status: DC
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
## 2022-04-16 NOTE — Discharge Instructions (Signed)
Return to MAU: -Worsening abdominal pain -Leaking watery fluid from vagina -Bright red vaginal bleeding
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
## 2022-04-19 ENCOUNTER — Ambulatory Visit: Payer: Medicaid Other | Attending: Maternal & Fetal Medicine

## 2022-04-19 DIAGNOSIS — Z362 Encounter for other antenatal screening follow-up: Secondary | ICD-10-CM | POA: Diagnosis not present

## 2022-04-19 DIAGNOSIS — Z3A23 23 weeks gestation of pregnancy: Secondary | ICD-10-CM

## 2022-04-27 ENCOUNTER — Ambulatory Visit (INDEPENDENT_AMBULATORY_CARE_PROVIDER_SITE_OTHER): Payer: Medicaid Other | Admitting: Licensed Clinical Social Worker

## 2022-04-27 ENCOUNTER — Ambulatory Visit (INDEPENDENT_AMBULATORY_CARE_PROVIDER_SITE_OTHER): Payer: Medicaid Other

## 2022-04-27 VITALS — BP 111/68 | HR 68 | Wt 172.2 lb

## 2022-04-27 DIAGNOSIS — Z87898 Personal history of other specified conditions: Secondary | ICD-10-CM

## 2022-04-27 DIAGNOSIS — A5901 Trichomonal vulvovaginitis: Secondary | ICD-10-CM

## 2022-04-27 DIAGNOSIS — Z3A24 24 weeks gestation of pregnancy: Secondary | ICD-10-CM

## 2022-04-27 DIAGNOSIS — F39 Unspecified mood [affective] disorder: Secondary | ICD-10-CM | POA: Diagnosis not present

## 2022-04-27 DIAGNOSIS — Z87828 Personal history of other (healed) physical injury and trauma: Secondary | ICD-10-CM

## 2022-04-27 DIAGNOSIS — Z3402 Encounter for supervision of normal first pregnancy, second trimester: Secondary | ICD-10-CM
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
## 2022-04-27 NOTE — Progress Notes (Signed)
Pt presents for ROB visit. No concerns at this time.
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
## 2022-05-25 ENCOUNTER — Encounter: Payer: Self-pay | Admitting: Obstetrics

## 2022-05-25 ENCOUNTER — Other Ambulatory Visit: Payer: Medicaid Other

## 2022-05-25 ENCOUNTER — Ambulatory Visit (INDEPENDENT_AMBULATORY_CARE_PROVIDER_SITE_OTHER): Payer: Medicaid Other | Admitting: Obstetrics

## 2022-05-25 VITALS — BP 112/60 | HR 75 | Wt 181.0 lb

## 2022-05-25 DIAGNOSIS — Z3A28 28 weeks gestation of pregnancy: Secondary | ICD-10-CM

## 2022-05-25 DIAGNOSIS — F39 Unspecified mood [affective] disorder: Secondary | ICD-10-CM

## 2022-05-25 DIAGNOSIS — Z87898 Personal history of other specified conditions: Secondary | ICD-10-CM

## 2022-05-25 DIAGNOSIS — Z3402 Encounter for supervision of normal first pregnancy, second trimester: Secondary | ICD-10-CM

## 2022-05-25 DIAGNOSIS — J301 Allergic rhinitis due to pollen: Secondary | ICD-10-CM

## 2022-05-25 DIAGNOSIS — Z3403 Encounter for supervision of normal first pregnancy, third trimester: Secondary | ICD-10-CM

## 2022-05-25 MED ORDER — LORATADINE 10 MG PO TABS: 10.0000 mg | ORAL_TABLET | Freq: Every day | ORAL | 11 refills | Status: DC

## 2022-05-25 MED ORDER — VITAFOL GUMMIES 3.33-0.333-34.8 MG PO CHEW: 3.0000 | CHEWABLE_TABLET | Freq: Every day | ORAL | 11 refills | Status: DC
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
## 2022-05-25 NOTE — Progress Notes (Signed)
Pt reports fetal movement, denies pain. Pt reports occasional headaches, has not tried any medications

## 2022-05-25 NOTE — Addendum Note (Signed)
Addended by: Baltazar Najjar A on: 05/25/2022 10:12 AM   Modules accepted: Orders

## 2022-05-26 LAB — RPR: RPR Ser Ql: NONREACTIVE

## 2022-05-26 LAB — HIV ANTIBODY (ROUTINE TESTING W REFLEX): HIV Screen 4th Generation wRfx: NONREACTIVE

## 2022-05-26 LAB — CBC
Hematocrit: 32.5 % — ABNORMAL LOW (ref 34.0–46.6)
Hemoglobin: 10.7 g/dL — ABNORMAL LOW (ref 11.1–15.9)
MCH: 30 pg (ref 26.6–33.0)
MCHC: 32.9 g/dL (ref 31.5–35.7)
MCV: 91 fL (ref 79–97)
Platelets: 232 10*3/uL (ref 150–450)
RBC: 3.57 x10E6/uL — ABNORMAL LOW (ref 3.77–5.28)
RDW: 12.2 % (ref 11.7–15.4)
WBC: 12.1 10*3/uL — ABNORMAL HIGH (ref 3.4–10.8)

## 2022-05-26 LAB — GLUCOSE TOLERANCE, 2 HOURS W/ 1HR
Glucose, 1 hour: 77 mg/dL (ref 70–179)
Glucose, 2 hour: 66 mg/dL — ABNORMAL LOW (ref 70–152)
Glucose, Fasting: 71 mg/dL (ref 70–91)

## 2022-05-27 ENCOUNTER — Other Ambulatory Visit: Payer: Self-pay | Admitting: Obstetrics

## 2022-05-27 DIAGNOSIS — O99013 Anemia complicating pregnancy, third trimester: Secondary | ICD-10-CM

## 2022-05-27 MED ORDER — ACCRUFER 30 MG PO CAPS: 1.0000 | ORAL_CAPSULE | Freq: Two times a day (BID) | ORAL | 3 refills | Status: DC

## 2022-05-27 NOTE — Progress Notes (Signed)
TC. No answer. Number is not in service. Message sent via MyChart about Accrufer and Blink RX. Education on anemia in pregnancy included.

## 2022-06-08 ENCOUNTER — Encounter: Payer: Medicaid Other | Admitting: Obstetrics

## 2022-06-13 ENCOUNTER — Encounter: Payer: Medicaid Other | Admitting: Obstetrics

## 2022-06-15 ENCOUNTER — Encounter: Payer: Self-pay | Admitting: Obstetrics

## 2022-06-15 ENCOUNTER — Ambulatory Visit (INDEPENDENT_AMBULATORY_CARE_PROVIDER_SITE_OTHER): Payer: Medicaid Other | Admitting: Obstetrics

## 2022-06-15 VITALS — BP 100/59 | HR 73 | Wt 180.0 lb

## 2022-06-15 DIAGNOSIS — J301 Allergic rhinitis due to pollen: Secondary | ICD-10-CM

## 2022-06-15 DIAGNOSIS — B3731 Acute candidiasis of vulva and vagina: Secondary | ICD-10-CM

## 2022-06-15 DIAGNOSIS — Z3401 Encounter for supervision of normal first pregnancy, first trimester: Secondary | ICD-10-CM

## 2022-06-15 DIAGNOSIS — O99013 Anemia complicating pregnancy, third trimester: Secondary | ICD-10-CM

## 2022-06-15 DIAGNOSIS — Z87898 Personal history of other specified conditions: Secondary | ICD-10-CM

## 2022-06-15 MED ORDER — CETIRIZINE HCL 10 MG PO TABS: 10.0000 mg | ORAL_TABLET | Freq: Every day | ORAL | 11 refills | Status: DC

## 2022-06-15 MED ORDER — TERCONAZOLE 0.8 % VA CREA: 1.0000 | TOPICAL_CREAM | Freq: Every day | VAGINAL | 2 refills | Status: DC

## 2022-06-15 NOTE — Progress Notes (Signed)
ROB, Pt is requesting Rx with refills for yeast.
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
## 2022-06-21 ENCOUNTER — Telehealth: Payer: Self-pay | Admitting: *Deleted

## 2022-06-21 NOTE — Telephone Encounter (Signed)
Returned TC to [facility] and verified pt is not taking another Iron supplement in addition to Accrufer that they are seeking to fill. Pt only taking PNV with iron per current med rec.

## 2022-06-29 ENCOUNTER — Ambulatory Visit (INDEPENDENT_AMBULATORY_CARE_PROVIDER_SITE_OTHER): Payer: Medicaid Other | Admitting: Obstetrics & Gynecology

## 2022-06-29 VITALS — BP 106/65 | HR 76 | Wt 189.2 lb

## 2022-06-29 DIAGNOSIS — O99013 Anemia complicating pregnancy, third trimester: Secondary | ICD-10-CM

## 2022-06-29 DIAGNOSIS — Z3401 Encounter for supervision of normal first pregnancy, first trimester: Secondary | ICD-10-CM

## 2022-06-29 MED ORDER — ACCRUFER 30 MG PO CAPS: 1.0000 | ORAL_CAPSULE | Freq: Two times a day (BID) | ORAL | 3 refills | Status: DC

## 2022-06-29 NOTE — Progress Notes (Signed)
ROB 33.[redacted] wks GA No unusual complaints
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
## 2022-07-13 ENCOUNTER — Encounter: Payer: Medicaid Other | Admitting: Family Medicine

## 2022-07-21 ENCOUNTER — Inpatient Hospital Stay (HOSPITAL_COMMUNITY): Payer: Medicaid Other | Admitting: Anesthesiology

## 2022-07-21 ENCOUNTER — Encounter (HOSPITAL_COMMUNITY): Payer: Self-pay | Admitting: Obstetrics and Gynecology

## 2022-07-21 ENCOUNTER — Inpatient Hospital Stay (HOSPITAL_COMMUNITY)
Admission: AD | Admit: 2022-07-21 | Discharge: 2022-07-23 | DRG: 807 | Disposition: A | Discharge status: Home / Self Care | Payer: Medicaid Other | Attending: Family Medicine | Admitting: Family Medicine

## 2022-07-21 ENCOUNTER — Encounter: Payer: Medicaid Other | Admitting: Obstetrics

## 2022-07-21 DIAGNOSIS — O99344 Other mental disorders complicating childbirth: Secondary | ICD-10-CM

## 2022-07-21 DIAGNOSIS — Z7982 Long term (current) use of aspirin: Secondary | ICD-10-CM | POA: Diagnosis not present

## 2022-07-21 DIAGNOSIS — Z87891 Personal history of nicotine dependence: Secondary | ICD-10-CM | POA: Diagnosis not present

## 2022-07-21 DIAGNOSIS — O26893 Other specified pregnancy related conditions, third trimester: Secondary | ICD-10-CM | POA: Diagnosis present

## 2022-07-21 DIAGNOSIS — Z3A36 36 weeks gestation of pregnancy: Secondary | ICD-10-CM | POA: Diagnosis not present

## 2022-07-21 DIAGNOSIS — Z87898 Personal history of other specified conditions: Secondary | ICD-10-CM

## 2022-07-21 LAB — CBC
HCT: 31.2 % — ABNORMAL LOW (ref 36.0–46.0)
Hemoglobin: 10.7 g/dL — ABNORMAL LOW (ref 12.0–15.0)
MCH: 30.3 pg (ref 26.0–34.0)
MCHC: 34.3 g/dL (ref 30.0–36.0)
MCV: 88.4 fL (ref 80.0–100.0)
Platelets: 187 10*3/uL (ref 150–400)
RBC: 3.53 MIL/uL — ABNORMAL LOW (ref 3.87–5.11)
RDW: 13.8 % (ref 11.5–15.5)
WBC: 14.1 10*3/uL — ABNORMAL HIGH (ref 4.0–10.5)
nRBC: 0 % (ref 0.0–0.2)

## 2022-07-21 LAB — TYPE AND SCREEN
ABO/RH(D): A POS
Antibody Screen: NEGATIVE

## 2022-07-21 LAB — RPR: RPR Ser Ql: NONREACTIVE

## 2022-07-21 LAB — POCT FERN TEST: POCT Fern Test: POSITIVE

## 2022-07-21 LAB — GROUP B STREP BY PCR: Group B strep by PCR: POSITIVE — AB

## 2022-07-21 MED ORDER — PHENYLEPHRINE 80 MCG/ML (10ML) SYRINGE FOR IV PUSH (FOR BLOOD PRESSURE SUPPORT)
80.0000 ug | PREFILLED_SYRINGE | INTRAVENOUS | Status: DC | PRN
  Filled 2022-07-21: qty 10

## 2022-07-21 MED ORDER — OXYCODONE-ACETAMINOPHEN 5-325 MG PO TABS: 2.0000 | ORAL_TABLET | ORAL | Status: DC | PRN

## 2022-07-21 MED ORDER — DIPHENHYDRAMINE HCL 25 MG PO CAPS: 25.0000 mg | ORAL_CAPSULE | Freq: Four times a day (QID) | ORAL | Status: DC | PRN

## 2022-07-21 MED ORDER — OXYCODONE-ACETAMINOPHEN 5-325 MG PO TABS: 1.0000 | ORAL_TABLET | ORAL | Status: DC | PRN

## 2022-07-21 MED ORDER — FENTANYL CITRATE (PF) 100 MCG/2ML IJ SOLN
50.0000 ug | INTRAMUSCULAR | Status: DC | PRN
  Administered 2022-07-21 (×2): 100 ug via INTRAVENOUS
  Filled 2022-07-21 (×2): qty 2

## 2022-07-21 MED ORDER — TERBUTALINE SULFATE 1 MG/ML IJ SOLN: 0.2500 mg | Freq: Once | INTRAMUSCULAR | Status: DC | PRN

## 2022-07-21 MED ORDER — IBUPROFEN 600 MG PO TABS
600.0000 mg | ORAL_TABLET | Freq: Four times a day (QID) | ORAL | Status: DC
  Administered 2022-07-21 – 2022-07-23 (×8): 600 mg via ORAL
  Filled 2022-07-21 (×8): qty 1

## 2022-07-21 MED ORDER — PENICILLIN G POT IN DEXTROSE 60000 UNIT/ML IV SOLN
3.0000 10*6.[IU] | INTRAVENOUS | Status: DC
  Administered 2022-07-21: 3 10*6.[IU] via INTRAVENOUS
  Filled 2022-07-21 (×5): qty 50

## 2022-07-21 MED ORDER — ACETAMINOPHEN 325 MG PO TABS: 650.0000 mg | ORAL_TABLET | ORAL | Status: DC | PRN

## 2022-07-21 MED ORDER — ONDANSETRON HCL 4 MG/2ML IJ SOLN: 4.0000 mg | INTRAMUSCULAR | Status: DC | PRN

## 2022-07-21 MED ORDER — MAGNESIUM HYDROXIDE 400 MG/5ML PO SUSP: 30.0000 mL | ORAL | Status: DC | PRN

## 2022-07-21 MED ORDER — TERBUTALINE SULFATE 1 MG/ML IJ SOLN
0.2500 mg | Freq: Once | INTRAMUSCULAR | Status: AC
  Administered 2022-07-21: 0.25 mg via SUBCUTANEOUS

## 2022-07-21 MED ORDER — LACTATED RINGERS IV SOLN
500.0000 mL | Freq: Once | INTRAVENOUS | Status: AC
  Administered 2022-07-21: 500 mL via INTRAVENOUS

## 2022-07-21 MED ORDER — FLEET ENEMA 7-19 GM/118ML RE ENEM: 1.0000 | ENEMA | RECTAL | Status: DC | PRN

## 2022-07-21 MED ORDER — EPHEDRINE 5 MG/ML INJ
10.0000 mg | INTRAVENOUS | Status: DC | PRN
  Filled 2022-07-21: qty 5

## 2022-07-21 MED ORDER — BENZOCAINE-MENTHOL 20-0.5 % EX AERO
1.0000 | INHALATION_SPRAY | CUTANEOUS | Status: DC | PRN
  Administered 2022-07-23: 1 via TOPICAL
  Filled 2022-07-21: qty 56

## 2022-07-21 MED ORDER — OXYTOCIN-SODIUM CHLORIDE 30-0.9 UT/500ML-% IV SOLN
1.0000 m[IU]/min | INTRAVENOUS | Status: DC
  Administered 2022-07-21: 2 m[IU]/min via INTRAVENOUS
  Filled 2022-07-21: qty 500

## 2022-07-21 MED ORDER — PRENATAL MULTIVITAMIN CH
1.0000 | ORAL_TABLET | Freq: Every day | ORAL | Status: DC
  Administered 2022-07-22 – 2022-07-23 (×2): 1 via ORAL
  Filled 2022-07-21 (×2): qty 1

## 2022-07-21 MED ORDER — LIDOCAINE HCL (PF) 1 % IJ SOLN: 30.0000 mL | INTRAMUSCULAR | Status: DC | PRN

## 2022-07-21 MED ORDER — DIPHENHYDRAMINE HCL 50 MG/ML IJ SOLN: 12.5000 mg | INTRAMUSCULAR | Status: DC | PRN

## 2022-07-21 MED ORDER — OXYTOCIN BOLUS FROM INFUSION
333.0000 mL | Freq: Once | INTRAVENOUS | Status: AC
  Administered 2022-07-21: 333 mL via INTRAVENOUS

## 2022-07-21 MED ORDER — TETANUS-DIPHTH-ACELL PERTUSSIS 5-2.5-18.5 LF-MCG/0.5 IM SUSY: 0.5000 mL | PREFILLED_SYRINGE | Freq: Once | INTRAMUSCULAR | Status: DC

## 2022-07-21 MED ORDER — SIMETHICONE 80 MG PO CHEW: 80.0000 mg | CHEWABLE_TABLET | ORAL | Status: DC | PRN

## 2022-07-21 MED ORDER — TERBUTALINE SULFATE 1 MG/ML IJ SOLN
INTRAMUSCULAR | Status: AC
  Filled 2022-07-21: qty 1

## 2022-07-21 MED ORDER — ONDANSETRON HCL 4 MG PO TABS: 4.0000 mg | ORAL_TABLET | ORAL | Status: DC | PRN

## 2022-07-21 MED ORDER — LACTATED RINGERS AMNIOINFUSION: INTRAVENOUS | Status: DC

## 2022-07-21 MED ORDER — MEASLES, MUMPS & RUBELLA VAC IJ SOLR: 0.5000 mL | Freq: Once | INTRAMUSCULAR | Status: DC

## 2022-07-21 MED ORDER — FENTANYL-BUPIVACAINE-NACL 0.5-0.125-0.9 MG/250ML-% EP SOLN
12.0000 mL/h | EPIDURAL | Status: DC | PRN
  Administered 2022-07-21: 12 mL/h via EPIDURAL
  Filled 2022-07-21: qty 250

## 2022-07-21 MED ORDER — SOD CITRATE-CITRIC ACID 500-334 MG/5ML PO SOLN: 30.0000 mL | ORAL | Status: DC | PRN

## 2022-07-21 MED ORDER — EPHEDRINE 5 MG/ML INJ
10.0000 mg | INTRAVENOUS | Status: AC | PRN
  Administered 2022-07-21 (×2): 10 mg via INTRAVENOUS

## 2022-07-21 MED ORDER — ONDANSETRON HCL 4 MG/2ML IJ SOLN
4.0000 mg | Freq: Four times a day (QID) | INTRAMUSCULAR | Status: DC | PRN
  Administered 2022-07-21: 4 mg via INTRAVENOUS
  Filled 2022-07-21: qty 2

## 2022-07-21 MED ORDER — PHENYLEPHRINE 80 MCG/ML (10ML) SYRINGE FOR IV PUSH (FOR BLOOD PRESSURE SUPPORT)
80.0000 ug | PREFILLED_SYRINGE | INTRAVENOUS | Status: AC | PRN
  Administered 2022-07-21 (×3): 80 ug via INTRAVENOUS

## 2022-07-21 MED ORDER — ACETAMINOPHEN 325 MG PO TABS
650.0000 mg | ORAL_TABLET | ORAL | Status: DC | PRN
  Administered 2022-07-22 – 2022-07-23 (×3): 650 mg via ORAL
  Filled 2022-07-21 (×3): qty 2

## 2022-07-21 MED ORDER — LACTATED RINGERS IV SOLN
500.0000 mL | INTRAVENOUS | Status: DC | PRN
  Administered 2022-07-21 (×3): 500 mL via INTRAVENOUS

## 2022-07-21 MED ORDER — WITCH HAZEL-GLYCERIN EX PADS: 1.0000 | MEDICATED_PAD | CUTANEOUS | Status: DC | PRN

## 2022-07-21 MED ORDER — SODIUM CHLORIDE 0.9 % IV SOLN
5.0000 10*6.[IU] | Freq: Once | INTRAVENOUS | Status: AC
  Filled 2022-07-21: qty 5

## 2022-07-21 MED ORDER — MEDROXYPROGESTERONE ACETATE 150 MG/ML IM SUSP: 150.0000 mg | INTRAMUSCULAR | Status: DC | PRN

## 2022-07-21 MED ORDER — COCONUT OIL OIL: 1.0000 | TOPICAL_OIL | Status: DC | PRN

## 2022-07-21 MED ORDER — OXYTOCIN-SODIUM CHLORIDE 30-0.9 UT/500ML-% IV SOLN
2.5000 [IU]/h | INTRAVENOUS | Status: DC
  Administered 2022-07-21: 2.5 [IU]/h via INTRAVENOUS

## 2022-07-21 MED ORDER — DIBUCAINE (PERIANAL) 1 % EX OINT: 1.0000 | TOPICAL_OINTMENT | CUTANEOUS | Status: DC | PRN

## 2022-07-21 MED ORDER — LACTATED RINGERS IV SOLN: INTRAVENOUS | Status: DC

## 2022-07-21 MED ORDER — LIDOCAINE-EPINEPHRINE (PF) 2 %-1:200000 IJ SOLN
INTRAMUSCULAR | Status: DC | PRN
  Administered 2022-07-21: 5 mL via EPIDURAL
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
## 2022-07-21 NOTE — Anesthesia Preprocedure Evaluation (Signed)
Anesthesia Evaluation  Patient identified by MRN, date of birth, ID band Patient awake    Reviewed: Allergy & Precautions, H&P , NPO status , Patient's Chart, lab work & pertinent test results  Airway Mallampati: II  TM Distance: >3 FB Neck ROM: Full    Dental no notable dental hx.    Pulmonary neg pulmonary ROS, former smoker   Pulmonary exam normal breath sounds clear to auscultation       Cardiovascular negative cardio ROS Normal cardiovascular exam Rhythm:Regular Rate:Normal     Neuro/Psych  PSYCHIATRIC DISORDERS Anxiety     negative neurological ROS     GI/Hepatic negative GI ROS, Neg liver ROS,,,  Endo/Other  negative endocrine ROS    Renal/GU negative Renal ROS  negative genitourinary   Musculoskeletal negative musculoskeletal ROS (+)    Abdominal   Peds negative pediatric ROS (+)  Hematology  (+) Blood dyscrasia, anemia   Anesthesia Other Findings   Reproductive/Obstetrics (+) Pregnancy                             Anesthesia Physical Anesthesia Plan  ASA: 2  Anesthesia Plan: Epidural   Post-op Pain Management:    Induction:   PONV Risk Score and Plan: Treatment may vary due to age or medical condition  Airway Management Planned: Natural Airway  Additional Equipment:   Intra-op Plan:   Post-operative Plan:   Informed Consent: I have reviewed the patients History and Physical, chart, labs and discussed the procedure including the risks, benefits and alternatives for the proposed anesthesia with the patient or authorized representative who has indicated his/her understanding and acceptance.       Plan Discussed with: Anesthesiologist  Anesthesia Plan Comments: (Patient identified. Risks, benefits, options discussed with patient including but not limited to bleeding, infection, nerve damage, paralysis, failed block, incomplete pain control, headache, blood pressure  changes, nausea, vomiting, reactions to medication, itching, and post partum back pain. Confirmed with bedside nurse the patient's most recent platelet count. Confirmed with the patient that they are not taking any anticoagulation, have any bleeding history or any family history of bleeding disorders. Patient expressed understanding and wishes to proceed. All questions were answered. )       Anesthesia Quick Evaluation

## 2022-07-21 NOTE — Lactation Note (Signed)
This note was copied from a baby's chart. Lactation Consultation Note  Patient Name: Marisa Long [id number] Date: 07/21/2022 Age:21 hours   Mom chooses to formula feed.  Maternal Data    Feeding Nipple Type: Slow - flow  LATCH Score                    Lactation Tools Discussed/Used    Interventions    Discharge    Consult Status Consult Status: Complete    [name] 07/21/2022, 8:05 PM

## 2022-07-21 NOTE — Progress Notes (Signed)
Marisa Long is a 22 y.o. G1P0 at [redacted]w[redacted]d.  Subjective: Comfortable with epidural.   Objective: BP (!) 76/36   Pulse 77   Temp 98.2 F (36.8 C) (Oral)   Resp 18   Ht 5\' 2"  (1.575 m)   Wt 84.6 kg   LMP 11/08/2021 (Exact Date)   SpO2 100%   BMI 34.11 kg/m    FHT:  FHR: 120 bpm, variability: mod,  accelerations:  15x15,  decelerations:  repetitve variables  UC:   Q 2-3 minutes, strong Dilation: 7 Effacement (%): 90 Cervical Position: Middle Station: -1, 0 Presentation: Vertex Exam by:: Evans, RN  Labs: Results for orders placed or performed during the hospital encounter of 07/21/22 (from the past 24 hour(s))  POCT fern test     Status: None   Collection Time: 07/21/22  5:45 AM  Result Value Ref Range   POCT Fern Test Positive = ruptured amniotic membanes   CBC     Status: Abnormal   Collection Time: 07/21/22  6:06 AM  Result Value Ref Range   WBC 14.1 (H) 4.0 - 10.5 K/uL   RBC 3.53 (L) 3.87 - 5.11 MIL/uL   Hemoglobin 10.7 (L) 12.0 - 15.0 g/dL   HCT 16.1 (L) 09.6 - 04.5 %   MCV 88.4 80.0 - 100.0 fL   MCH 30.3 26.0 - 34.0 pg   MCHC 34.3 30.0 - 36.0 g/dL   RDW 40.9 81.1 - 91.4 %   Platelets 187 150 - 400 K/uL   nRBC 0.0 0.0 - 0.2 %  Type and screen [facility]     Status: None   Collection Time: 07/21/22  6:06 AM  Result Value Ref Range   ABO/RH(D) A POS    Antibody Screen NEG    Sample Expiration      07/24/2022,2359 Performed at [facility], [address] [place], [place] [address]   RPR     Status: None   Collection Time: 07/21/22  6:06 AM  Result Value Ref Range   RPR Ser Ql NON REACTIVE NON REACTIVE    Assessment / Plan: [redacted]w[redacted]d week IUP Labor: transition Fetal Wellbeing:  Category II, due to variables but mod variability and accels present. Likely R/T hypotension after epidural. Improved with Phenylephrine, fluid bolus, position changes, Terb, but variables still present. IUPC placed. Amnioinfusion started. Attending notified and  agrees w/ POC.  Pain Control:  Epidural Anticipated MOD:  SVD Post-epidural Hypotension: Will consult anesthesia if it continues to be an issues.  Katrinka Blazing, [place], CNM 07/21/2022 9:21 AM

## 2022-07-21 NOTE — Anesthesia Procedure Notes (Addendum)
Epidural Patient location during procedure: OB Start time: 07/21/2022 8:05 AM End time: 07/21/2022 8:15 AM  Staffing Anesthesiologist: Elmer Picker, MD Performed: anesthesiologist   Preanesthetic Checklist Completed: patient identified, IV checked, risks and benefits discussed, monitors and equipment checked, pre-op evaluation and timeout performed  Epidural Patient position: sitting Prep: DuraPrep and site prepped and draped Patient monitoring: continuous pulse ox, blood pressure, heart rate and cardiac monitor Approach: midline Location: L3-L4 Injection technique: LOR air  Needle:  Needle type: Tuohy  Needle gauge: 17 G Needle length: 9 cm Needle insertion depth: 5 cm Catheter type: closed end flexible Catheter size: 19 Gauge Catheter at skin depth: 10 cm Test dose: negative  Assessment Sensory level: T8 Events: blood not aspirated, no cerebrospinal fluid, injection not painful, no injection resistance, no paresthesia and negative IV test  Additional Notes Patient identified. Risks/Benefits/Options discussed with patient including but not limited to bleeding, infection, nerve damage, paralysis, failed block, incomplete pain control, headache, blood pressure changes, nausea, vomiting, reactions to medication both or allergic, itching and postpartum back pain. Confirmed with bedside nurse the patient's most recent platelet count. Confirmed with patient that they are not currently taking any anticoagulation, have any bleeding history or any family history of bleeding disorders. Patient expressed understanding and wished to proceed. All questions were answered. Sterile technique was used throughout the entire procedure. Please see nursing notes for vital signs. Test dose was given through epidural catheter and negative prior to continuing to dose epidural or start infusion. Warning signs of high block given to the patient including shortness of breath, tingling/numbness in hands,  complete motor block, or any concerning symptoms with instructions to call for help. Patient was given instructions on fall risk and not to get out of bed. All questions and concerns addressed with instructions to call with any issues or inadequate analgesia.  Reason for block:procedure for pain

## 2022-07-21 NOTE — Anesthesia Procedure Notes (Deleted)
Epidural Patient location during procedure: OB Start time: 07/21/2022 8:05 AM End time: 07/21/2022 8:15 AM  Staffing Anesthesiologist: Benisha Hadaway L, MD Performed: anesthesiologist   Preanesthetic Checklist Completed: patient identified, IV checked, risks and benefits discussed, monitors and equipment checked, pre-op evaluation and timeout performed  Epidural Patient position: sitting Prep: DuraPrep and site prepped and draped Patient monitoring: continuous pulse ox, blood pressure, heart rate and cardiac monitor Approach: midline Location: L3-L4 Injection technique: LOR air  Needle:  Needle type: Tuohy  Needle gauge: 17 G Needle length: 9 cm Needle insertion depth: 5 cm Catheter type: closed end flexible Catheter size: 19 Gauge Catheter at skin depth: 10 cm Test dose: negative  Assessment Sensory level: T8 Events: blood not aspirated, no cerebrospinal fluid, injection not painful, no injection resistance, no paresthesia and negative IV test  Additional Notes Patient identified. Risks/Benefits/Options discussed with patient including but not limited to bleeding, infection, nerve damage, paralysis, failed block, incomplete pain control, headache, blood pressure changes, nausea, vomiting, reactions to medication both or allergic, itching and postpartum back pain. Confirmed with bedside nurse the patient's most recent platelet count. Confirmed with patient that they are not currently taking any anticoagulation, have any bleeding history or any family history of bleeding disorders. Patient expressed understanding and wished to proceed. All questions were answered. Sterile technique was used throughout the entire procedure. Please see nursing notes for vital signs. Test dose was given through epidural catheter and negative prior to continuing to dose epidural or start infusion. Warning signs of high block given to the patient including shortness of breath, tingling/numbness in hands,  complete motor block, or any concerning symptoms with instructions to call for help. Patient was given instructions on fall risk and not to get out of bed. All questions and concerns addressed with instructions to call with any issues or inadequate analgesia.  Reason for block:procedure for pain     

## 2022-07-21 NOTE — MAU Note (Signed)
.  Marisa Long is a 22 y.o. at [redacted]w[redacted]d here in MAU reporting:   Contractions every: 2-3 minutes since 0330 Onset of ctx: Today Pain score: 7/10  ROM: Possible ROM small leaking of clear fluid since 0330, pt not wearing a pad  Vaginal Bleeding: None Last SVE: ukn  Epidural: Planning  Fetal Movement: Reports positive FM FHT:116 via External  Vitals:   07/21/22 0534  BP: (!) 123/59  Pulse: 78  Resp: 18  SpO2: 96%       OB Office: [facility] GBS: Unknown HSV: Denies hx of HSV Lab orders placed from triage: MAU Labor Eval
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
## 2022-07-21 NOTE — Progress Notes (Addendum)
Marisa Long is a 22 y.o. G1P0 at [redacted]w[redacted]d.  Subjective: Comfortable w/ epidural   Objective: BP (!) 94/50   Pulse 66   Temp 98 F (36.7 C) (Oral)   Resp 18   Ht 5\' 2"  (1.575 m)   Wt 84.6 kg   LMP 11/08/2021 (Exact Date)   SpO2 97%   BMI 34.11 kg/m    Patient Vitals for the past 24 hrs:  BP Temp Temp src Pulse Resp SpO2 Height Weight  07/21/22 1331 (!) 94/50 -- -- 66 -- -- -- --  07/21/22 1329 (!) 100/51 98 F (36.7 C) Oral 66 -- -- -- --  07/21/22 1304 (!) 104/53 -- -- 65 -- 97 % -- --  07/21/22 1302 (!) 76/22 -- -- 68 -- -- -- --  07/21/22 1231 95/62 -- -- (!) 132 18 -- -- --  07/21/22 1201 (!) 87/45 -- -- 69 -- -- -- --  07/21/22 1147 (!) 89/36 98 F (36.7 C) Oral 66 -- -- -- --  07/21/22 1143 (!) 87/35 -- -- 67 -- -- -- --  07/21/22 1131 (!) 86/38 -- -- 64 -- -- -- --  07/21/22 1101 (!) 98/49 -- -- 77 -- -- -- --  07/21/22 1031 (!) 97/53 -- -- 79 -- -- -- --  07/21/22 1001 (!) 87/58 -- -- 72 -- -- -- --     FHT:  FHR: 120 bpm, variability: mod,  accelerations:  15x15,  decelerations: variables UC:   Q 3-5 minutes, MVU's 70-110 Dilation: 7.5 Effacement (%): 100 Cervical Position: Anterior Station: 0 Presentation: Vertex Exam by:: Deatra Robinson, RN  Labs:  Specimen: Vaginal/Rectal; Genital  Result Value Ref Range   Group B strep by PCR POSITIVE (A) NEGATIVE    Assessment / Plan: [redacted]w[redacted]d week IUP Labor: protracted active phase by MVU's inadequate. Considering augmentation if FHR remains reassuring.  Fetal Wellbeing:  Category II, improving significantly with position change to thrown. Now with mild variables. Amnioinfusion running.  Pain Control:  Epidural  Anticipated MOD:  Uncertain due to combination of repetitive variables and slow labor progress which is likely due to inadequate contractions. Discussed these challenges and concerns with pt and attending .  Post-epidural Hypotension: Slight improvement. Still some concern that FHR tracing issues are R/T BP.    Katrinka Blazing, [place], CNM 07/21/2022 1:51 PM

## 2022-07-21 NOTE — Progress Notes (Signed)
Laterria Goodie is a 22 y.o. G1P0101 at [redacted]w[redacted]d.  Subjective: Mild pelvic and back pressure.   Objective: Date/Time Temp Pulse ECG Heart Rate Resp BP Patient Position (if appropriate) SpO2 O2 Device O2 Flow Rate (L/min) FiO2 (%) Weight Who  07/21/22 1501 -- 78 -- -- 86/68 Abnormal  -- -- -- -- -- -- CE   FHT:  FHR: 110 bpm, variability: mod,  accelerations:  15x15,  decelerations:  variables UC:   Q 2-5 minutes, -mod-strong Dilation: Ant lip Effacement (%): 100 Cervical Position: Middle Station: 0 Presentation: Vertex Exam by:: [employer]  Labs: NA  Assessment / Plan: [redacted]w[redacted]d week IUP Labor: Transition, progressing well on pitocin.  Fetal Wellbeing:  Category II, but much improved with position change. Reassuring,. Attending reviewed tracing.   Pain Control:  Epidural.  Anticipated MOD:  SVD or possible vacuum if indicated by FHR.   Katrinka Blazing, [place], CNM 07/21/2022 3:20 PM

## 2022-07-22 LAB — CBC
HCT: 26.7 % — ABNORMAL LOW (ref 36.0–46.0)
Hemoglobin: 9.2 g/dL — ABNORMAL LOW (ref 12.0–15.0)
MCH: 31 pg (ref 26.0–34.0)
MCHC: 34.5 g/dL (ref 30.0–36.0)
MCV: 89.9 fL (ref 80.0–100.0)
Platelets: 150 10*3/uL (ref 150–400)
RBC: 2.97 MIL/uL — ABNORMAL LOW (ref 3.87–5.11)
RDW: 14.1 % (ref 11.5–15.5)
WBC: 17.7 10*3/uL — ABNORMAL HIGH (ref 4.0–10.5)
nRBC: 0 % (ref 0.0–0.2)
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
## 2022-07-22 NOTE — Progress Notes (Addendum)
POSTPARTUM PROGRESS NOTE  Post Partum Day 1  Subjective:  Marisa Long is a 22 y.o. G1P0101 s/p VAVD at [redacted]w[redacted]d.  She reports she is doing well. No acute events overnight. She denies any problems with ambulating, voiding or po intake. Denies nausea or vomiting. Denies fatigue, SOB, lightheadedness. Pain is well controlled but endorses some back pain 4/10.  Lochia is light.  Objective: Blood pressure (!) 101/54, pulse 62, temperature 98.1 F (36.7 C), temperature source Oral, resp. rate 16, height 5\' 2"  (1.575 m), weight 84.6 kg, last menstrual period 11/08/2021, SpO2 100 %, unknown if currently breastfeeding.  Physical Exam:  General: alert, cooperative and no distress Chest: no respiratory distress Heart:regular rate, distal pulses intact Uterine Fundus: firm, appropriately tender DVT Evaluation: No calf swelling or tenderness Extremities: Mild edema bilaterally Skin: warm, dry  Recent Labs    07/21/22 0606 07/22/22 0634  HGB 10.7* 9.2*  HCT 31.2* 26.7*   Assessment/Plan: Marisa Long is a 22 y.o. G1P0101 s/p VAVD at [redacted]w[redacted]d.  PPD#1 - Doing well  Routine postpartum care  -BP has remained soft. Continue to monitor.  -Hb 10.7-->9.2. Initiate PO iron.  Contraception: Condoms Feeding: Bottle/Formula Dispo: Plan for discharge tomorrow morning.   LOS: 1 day   Karma Ganja, Student-PA OB Fellow  07/22/2022, 7:40 AM    Attestation of Attending Supervision of Provider:  Evaluation and management procedures were performed by this provider under my supervision and collaboration. I have reviewed the provider's note and chart, and I agree with the management and plan.   Shonna Chock, MD Faculty Practice, [facility]

## 2022-07-22 NOTE — Progress Notes (Signed)
Preprocedural Counseling: Parent desires circumcision for this female infant.  Circumcision procedure details discussed, risks and benefits of procedure were also discussed.  The benefits include but are not limited to: reduction in the rates of urinary tract infection (UTI), penile cancer, sexually transmitted infections including HIV, penile inflammatory and retractile disorders.   Risks include but are not limited to: bleeding, infection, injury of glans which may lead to penile deformity or urinary tract issues or Urology intervention, unsatisfactory cosmetic appearance and other potential complications related to the procedure.  It was emphasized that this is an elective procedure.    

## 2022-07-22 NOTE — Anesthesia Postprocedure Evaluation (Signed)
Anesthesia Post Note  Patient: Marisa Long  Procedure(s) Performed: AN AD HOC LABOR EPIDURAL     Patient location during evaluation: Mother Baby Anesthesia Type: Epidural Level of consciousness: awake and alert Pain management: pain level controlled Vital Signs Assessment: post-procedure vital signs reviewed and stable Respiratory status: spontaneous breathing, nonlabored ventilation and respiratory function stable Cardiovascular status: stable Postop Assessment: no headache, no backache, epidural receding, no apparent nausea or vomiting, patient able to bend at knees, able to ambulate and adequate PO intake Anesthetic complications: no   No notable events documented.  Last Vitals:  Vitals:   07/22/22 0323 07/22/22 0818  BP: (!) 101/54 (!) 98/54  Pulse: 62 63  Resp: 16 16  Temp: 36.7 C 37.1 C  SpO2: 100% 100%    Last Pain:  Vitals:   07/22/22 0818  TempSrc: Oral  PainSc: 5    Pain Goal:                Epidural/Spinal Function Cutaneous sensation: Normal sensation (07/22/22 0818)  Laban Emperor

## 2022-07-22 NOTE — Progress Notes (Signed)
CSW received and acknowledges consult for EDPS of 9.  Consult screened out due to 9 on EDPS does not warrant a CSW consult.  MOB whom scores are greater than 9/yes to question 10 on Edinburgh Postpartum Depression Screen warrants a CSW consult.   Brittie Whisnant, LCSWA Clinical Social Worker [phone]  

## 2022-07-23 MED ORDER — ACETAMINOPHEN 325 MG PO TABS: 650.0000 mg | ORAL_TABLET | ORAL | 0 refills | Status: DC | PRN

## 2022-07-23 MED ORDER — BENZOCAINE-MENTHOL 20-0.5 % EX AERO: 1.0000 | INHALATION_SPRAY | CUTANEOUS | 0 refills | Status: DC | PRN

## 2022-07-23 MED ORDER — IBUPROFEN 600 MG PO TABS: 600.0000 mg | ORAL_TABLET | Freq: Four times a day (QID) | ORAL | 0 refills | Status: DC

## 2022-08-01 ENCOUNTER — Telehealth (HOSPITAL_COMMUNITY): Payer: Self-pay | Admitting: *Deleted

## 2022-08-01 NOTE — Telephone Encounter (Signed)
Attempted hospital discharge follow-up call. Left message for patient to return RN call with any questions or concerns. Deforest Hoyles, RN, 08/01/22, [phone]

## 2022-08-18 ENCOUNTER — Ambulatory Visit (INDEPENDENT_AMBULATORY_CARE_PROVIDER_SITE_OTHER): Payer: Medicaid Other | Admitting: Obstetrics

## 2022-08-18 ENCOUNTER — Encounter: Payer: Self-pay | Admitting: Obstetrics

## 2022-08-18 DIAGNOSIS — Z1339 Encounter for screening examination for other mental health and behavioral disorders: Secondary | ICD-10-CM | POA: Diagnosis not present

## 2022-08-18 DIAGNOSIS — Z87898 Personal history of other specified conditions: Secondary | ICD-10-CM

## 2022-08-18 DIAGNOSIS — Z3009 Encounter for other general counseling and advice on contraception: Secondary | ICD-10-CM

## 2022-08-18 DIAGNOSIS — F53 Postpartum depression: Secondary | ICD-10-CM
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
## 2022-10-10 ENCOUNTER — Institutional Professional Consult (permissible substitution): Payer: Medicaid Other | Admitting: Licensed Clinical Social Worker

## 2022-10-10 ENCOUNTER — Ambulatory Visit: Payer: Medicaid Other | Admitting: Obstetrics

## 2022-10-11 ENCOUNTER — Institutional Professional Consult (permissible substitution): Payer: Medicaid Other | Admitting: Licensed Clinical Social Worker

## 2022-10-11 ENCOUNTER — Ambulatory Visit: Payer: Medicaid Other | Admitting: Obstetrics and Gynecology

## 2022-11-01 ENCOUNTER — Institutional Professional Consult (permissible substitution): Payer: Medicaid Other | Admitting: Licensed Clinical Social Worker

## 2022-11-01 ENCOUNTER — Ambulatory Visit (INDEPENDENT_AMBULATORY_CARE_PROVIDER_SITE_OTHER): Payer: Medicaid Other | Admitting: Obstetrics and Gynecology

## 2022-11-01 ENCOUNTER — Encounter: Payer: Self-pay | Admitting: Obstetrics and Gynecology

## 2022-11-01 ENCOUNTER — Other Ambulatory Visit (HOSPITAL_COMMUNITY)
Admission: RE | Admit: 2022-11-01 | Discharge: 2022-11-01 | Disposition: A | Discharge status: Home / Self Care | Payer: Medicaid Other | Source: Ambulatory Visit | Attending: Obstetrics | Admitting: Obstetrics

## 2022-11-01 VITALS — BP 118/68 | HR 68 | Ht 62.0 in | Wt 165.0 lb

## 2022-11-01 DIAGNOSIS — Z309 Encounter for contraceptive management, unspecified: Secondary | ICD-10-CM | POA: Insufficient documentation

## 2022-11-01 DIAGNOSIS — Z30011 Encounter for initial prescription of contraceptive pills: Secondary | ICD-10-CM | POA: Diagnosis not present

## 2022-11-01 DIAGNOSIS — N898 Other specified noninflammatory disorders of vagina: Secondary | ICD-10-CM | POA: Insufficient documentation

## 2022-11-01 MED ORDER — LO LOESTRIN FE 1 MG-10 MCG / 10 MCG PO TABS
1.0000 | ORAL_TABLET | Freq: Every day | ORAL | 11 refills | Status: AC
Start: 2022-11-01 — End: ?

## 2022-11-01 NOTE — Progress Notes (Signed)
Marisa Long presents today with c/o vaginal discharge and desire to start contraception  Pt desires OCP's. Has not used in the past. LMP 10/23/22  PE AF VSS Lungs clear Heart RRR Abd soft + BS  A/P Vaginal discharge         Contraception management  Self swab today for vaginal discharge. F/U as per results  OCP's reviewed with pt. U/R/B/ and backup method discussed Will start LoLoestrin with next cycle. F/U in 3-4 months to gage response and to complete pap smear as pt is now 17

## 2022-11-01 NOTE — Progress Notes (Signed)
Pt is in office for vaginal self swab and discuss BC - pt is interested in pill.Pt in no longer breastfeeding and not sexually active.  Pt would like to defer pap today.

## 2022-11-02 ENCOUNTER — Ambulatory Visit (INDEPENDENT_AMBULATORY_CARE_PROVIDER_SITE_OTHER): Payer: Medicaid Other | Admitting: Licensed Clinical Social Worker

## 2022-11-02 DIAGNOSIS — Z87828 Personal history of other (healed) physical injury and trauma: Secondary | ICD-10-CM

## 2022-11-02 DIAGNOSIS — Z9189 Other specified personal risk factors, not elsewhere classified: Secondary | ICD-10-CM | POA: Diagnosis not present

## 2022-11-02 LAB — CERVICOVAGINAL ANCILLARY ONLY
Bacterial Vaginitis (gardnerella): POSITIVE — AB
Candida Glabrata: NEGATIVE
Candida Vaginitis: NEGATIVE
Chlamydia: NEGATIVE
Comment: NEGATIVE
Comment: NEGATIVE
Comment: NEGATIVE
Comment: NEGATIVE
Comment: NEGATIVE
Comment: NORMAL
Neisseria Gonorrhea: NEGATIVE
Trichomonas: NEGATIVE

## 2022-11-03 ENCOUNTER — Other Ambulatory Visit: Payer: Self-pay

## 2022-11-03 DIAGNOSIS — B9689 Other specified bacterial agents as the cause of diseases classified elsewhere: Secondary | ICD-10-CM

## 2022-11-03 MED ORDER — METRONIDAZOLE 500 MG PO TABS
500.0000 mg | ORAL_TABLET | Freq: Two times a day (BID) | ORAL | 0 refills | Status: AC
Start: 2022-11-03 — End: 2022-11-10

## 2022-11-09 NOTE — BH Specialist Note (Signed)
Integrated Behavioral Health via Telemedicine Visit  11/09/2022 Marisa Long [id number]  Number of Integrated Behavioral Health Clinician visits: 2 Session Start time:  9:33AM Session End time: 10:10AM Total time in minutes: 36 MINS VIA PHONE PER PT REQUEST   Referring Provider: Dr. Alysia Penna  Patient/Family location: [facility] Provider location: [facility] All persons participating in visit: Pt T Atlas and LCSW A Jamaul Heist  Types of Service: Individual psychotherapy and Telephone visit  I connected with Beverly Gust and/or Carolan Monnier's n/a via  Telephone or Video Enabled Telemedicine Application  (Video is Caregility application) and verified that I am speaking with the correct person using two identifiers. Discussed confidentiality: yes  I discussed the limitations of telemedicine and the availability of in person appointments.  Discussed there is a possibility of technology failure and discussed alternative modes of communication if that failure occurs.  I discussed that engaging in this telemedicine visit, they consent to the provision of behavioral healthcare and the services will be billed under their insurance.  Patient and/or legal guardian expressed understanding and consented to Telemedicine visit: Yes   Presenting Concerns: Patient and/or family reports the following symptoms/concerns: changes in mood, at risk for postpartum depression Duration of problem: approx 8 months ; Severity of problem: mild  Patient and/or Family's Strengths/Protective Factors: Concrete supports in place (healthy food, safe environments, etc.)  Goals Addressed: Patient will:  Reduce symptoms of: mood instability   Increase knowledge and/or ability of: coping skills   Demonstrate ability to: Increase healthy adjustment to current life circumstances  Progress towards Goals: Ongoing  Interventions: Interventions utilized:  Motivational Interviewing and Supportive Counseling Standardized  Assessments completed: Not Needed  Patient and/or Family Response: Ms. Geitner reports adjusting and bonding well with newborn. Ms. Swanigan reports supportive family and no further concerns.   Assessment: Patient currently experiencing at risk for postpartum depression.   Patient may benefit from integrated behavioral health.  Plan: Follow up with behavioral health clinician on : as needed  Behavioral recommendations: communicate with support person and delegate task to prevent burnout, prioritize rest and engage in self care  Referral(s): Integrated [employer] (In Clinic)  I discussed the assessment and treatment plan with the patient and/or parent/guardian. They were provided an opportunity to ask questions and all were answered. They agreed with the plan and demonstrated an understanding of the instructions.   They were advised to call back or seek an in-person evaluation if the symptoms worsen or if the condition fails to improve as anticipated.  Gwyndolyn Saxon, LCSW

## 2023-03-22 ENCOUNTER — Ambulatory Visit: Payer: Medicaid Other | Admitting: Obstetrics and Gynecology

## 2023-05-10 ENCOUNTER — Telehealth (HOSPITAL_COMMUNITY): Payer: Self-pay | Admitting: Pharmacy Technician

## 2023-05-10 ENCOUNTER — Other Ambulatory Visit (HOSPITAL_COMMUNITY): Payer: Self-pay

## 2023-05-10 ENCOUNTER — Encounter (HOSPITAL_COMMUNITY): Payer: Self-pay | Admitting: Internal Medicine

## 2023-05-10 ENCOUNTER — Emergency Department (HOSPITAL_COMMUNITY): Payer: Medicaid Other

## 2023-05-10 ENCOUNTER — Inpatient Hospital Stay (HOSPITAL_COMMUNITY)
Admission: EM | Admit: 2023-05-10 | Discharge: 2023-05-12 | DRG: 638 | Disposition: A | Discharge status: Home / Self Care | Payer: Medicaid Other | Attending: Internal Medicine | Admitting: Internal Medicine

## 2023-05-10 ENCOUNTER — Other Ambulatory Visit: Payer: Self-pay

## 2023-05-10 DIAGNOSIS — E875 Hyperkalemia: Secondary | ICD-10-CM | POA: Diagnosis present

## 2023-05-10 DIAGNOSIS — Z87891 Personal history of nicotine dependence: Secondary | ICD-10-CM | POA: Diagnosis not present

## 2023-05-10 DIAGNOSIS — Z8249 Family history of ischemic heart disease and other diseases of the circulatory system: Secondary | ICD-10-CM

## 2023-05-10 DIAGNOSIS — Z1152 Encounter for screening for COVID-19: Secondary | ICD-10-CM | POA: Diagnosis not present

## 2023-05-10 DIAGNOSIS — B379 Candidiasis, unspecified: Secondary | ICD-10-CM | POA: Diagnosis present

## 2023-05-10 DIAGNOSIS — E1110 Type 2 diabetes mellitus with ketoacidosis without coma: Secondary | ICD-10-CM | POA: Diagnosis present

## 2023-05-10 DIAGNOSIS — R070 Pain in throat: Secondary | ICD-10-CM | POA: Diagnosis present

## 2023-05-10 DIAGNOSIS — N179 Acute kidney failure, unspecified: Secondary | ICD-10-CM | POA: Diagnosis present

## 2023-05-10 DIAGNOSIS — D72829 Elevated white blood cell count, unspecified: Secondary | ICD-10-CM | POA: Diagnosis present

## 2023-05-10 DIAGNOSIS — E860 Dehydration: Secondary | ICD-10-CM | POA: Diagnosis present

## 2023-05-10 DIAGNOSIS — E876 Hypokalemia: Secondary | ICD-10-CM | POA: Diagnosis present

## 2023-05-10 DIAGNOSIS — Z833 Family history of diabetes mellitus: Secondary | ICD-10-CM | POA: Diagnosis not present

## 2023-05-10 DIAGNOSIS — D75839 Thrombocytosis, unspecified: Secondary | ICD-10-CM | POA: Diagnosis present

## 2023-05-10 DIAGNOSIS — E1010 Type 1 diabetes mellitus with ketoacidosis without coma: Secondary | ICD-10-CM | POA: Diagnosis present

## 2023-05-10 HISTORY — DX: Anemia, unspecified: D64.9

## 2023-05-10 LAB — I-STAT CHEM 8, ED
BUN: 22 mg/dL — ABNORMAL HIGH (ref 6–20)
Calcium, Ion: 1.12 mmol/L — ABNORMAL LOW (ref 1.15–1.40)
Chloride: 108 mmol/L (ref 98–111)
Creatinine, Ser: 0.9 mg/dL (ref 0.44–1.00)
Glucose, Bld: 603 mg/dL (ref 70–99)
HCT: 49 % — ABNORMAL HIGH (ref 36.0–46.0)
Hemoglobin: 16.7 g/dL — ABNORMAL HIGH (ref 12.0–15.0)
Potassium: 5.4 mmol/L — ABNORMAL HIGH (ref 3.5–5.1)
Sodium: 132 mmol/L — ABNORMAL LOW (ref 135–145)
TCO2: 8 mmol/L — ABNORMAL LOW (ref 22–32)

## 2023-05-10 LAB — HEMOGLOBIN A1C
Hgb A1c MFr Bld: 10.8 % — ABNORMAL HIGH (ref 4.8–5.6)
Mean Plasma Glucose: 263.26 mg/dL

## 2023-05-10 LAB — CBG MONITORING, ED
Glucose-Capillary: 541 mg/dL (ref 70–99)
Glucose-Capillary: 569 mg/dL (ref 70–99)
Glucose-Capillary: 531 mg/dL (ref 70–99)
Glucose-Capillary: 419 mg/dL — ABNORMAL HIGH (ref 70–99)
Glucose-Capillary: 481 mg/dL — ABNORMAL HIGH (ref 70–99)
Glucose-Capillary: 338 mg/dL — ABNORMAL HIGH (ref 70–99)
Glucose-Capillary: 250 mg/dL — ABNORMAL HIGH (ref 70–99)
Glucose-Capillary: 196 mg/dL — ABNORMAL HIGH (ref 70–99)
Glucose-Capillary: 202 mg/dL — ABNORMAL HIGH (ref 70–99)
Glucose-Capillary: 194 mg/dL — ABNORMAL HIGH (ref 70–99)

## 2023-05-10 LAB — CBC
HCT: 46 % (ref 36.0–46.0)
Hemoglobin: 14.4 g/dL (ref 12.0–15.0)
MCH: 28.4 pg (ref 26.0–34.0)
MCHC: 31.3 g/dL (ref 30.0–36.0)
MCV: 90.7 fL (ref 80.0–100.0)
Platelets: 456 10*3/uL — ABNORMAL HIGH (ref 150–400)
RBC: 5.07 MIL/uL (ref 3.87–5.11)
RDW: 15 % (ref 11.5–15.5)
WBC: 11.2 10*3/uL — ABNORMAL HIGH (ref 4.0–10.5)
nRBC: 0 % (ref 0.0–0.2)

## 2023-05-10 LAB — BASIC METABOLIC PANEL
Anion gap: 12 (ref 5–15)
Anion gap: 13 (ref 5–15)
BUN: 20 mg/dL (ref 6–20)
BUN: 17 mg/dL (ref 6–20)
BUN: 16 mg/dL (ref 6–20)
CO2: <7 mmol/L — ABNORMAL LOW (ref 22–32)
CO2: 17 mmol/L — ABNORMAL LOW (ref 22–32)
CO2: 18 mmol/L — ABNORMAL LOW (ref 22–32)
Calcium: 10.3 mg/dL (ref 8.9–10.3)
Calcium: 9.8 mg/dL (ref 8.9–10.3)
Calcium: 9.9 mg/dL (ref 8.9–10.3)
Chloride: 96 mmol/L — ABNORMAL LOW (ref 98–111)
Chloride: 106 mmol/L (ref 98–111)
Chloride: 105 mmol/L (ref 98–111)
Creatinine, Ser: 1.34 mg/dL — ABNORMAL HIGH (ref 0.44–1.00)
Creatinine, Ser: 0.92 mg/dL (ref 0.44–1.00)
Creatinine, Ser: 0.72 mg/dL (ref 0.44–1.00)
GFR, Estimated: 57 mL/min — ABNORMAL LOW (ref >60)
GFR, Estimated: >60 mL/min (ref >60)
GFR, Estimated: >60 mL/min (ref >60)
Glucose, Bld: 581 mg/dL (ref 70–99)
Glucose, Bld: 216 mg/dL — ABNORMAL HIGH (ref 70–99)
Glucose, Bld: 159 mg/dL — ABNORMAL HIGH (ref 70–99)
Potassium: 5.4 mmol/L — ABNORMAL HIGH (ref 3.5–5.1)
Potassium: 3.9 mmol/L (ref 3.5–5.1)
Potassium: 3.6 mmol/L (ref 3.5–5.1)
Sodium: 133 mmol/L — ABNORMAL LOW (ref 135–145)
Sodium: 135 mmol/L (ref 135–145)
Sodium: 136 mmol/L (ref 135–145)

## 2023-05-10 LAB — GLUCOSE, CAPILLARY
Glucose-Capillary: 133 mg/dL — ABNORMAL HIGH (ref 70–99)
Glucose-Capillary: 147 mg/dL — ABNORMAL HIGH (ref 70–99)
Glucose-Capillary: 157 mg/dL — ABNORMAL HIGH (ref 70–99)
Glucose-Capillary: 163 mg/dL — ABNORMAL HIGH (ref 70–99)
Glucose-Capillary: 184 mg/dL — ABNORMAL HIGH (ref 70–99)

## 2023-05-10 LAB — RESP PANEL BY RT-PCR (RSV, FLU A&B, COVID)  RVPGX2
Influenza A by PCR: NEGATIVE
Influenza B by PCR: NEGATIVE
Resp Syncytial Virus by PCR: NEGATIVE
SARS Coronavirus 2 by RT PCR: NEGATIVE

## 2023-05-10 LAB — COMPREHENSIVE METABOLIC PANEL
ALT: 25 U/L (ref 0–44)
AST: 12 U/L — ABNORMAL LOW (ref 15–41)
Albumin: 4.8 g/dL (ref 3.5–5.0)
Alkaline Phosphatase: 108 U/L (ref 38–126)
BUN: 22 mg/dL — ABNORMAL HIGH (ref 6–20)
CO2: <7 mmol/L — ABNORMAL LOW (ref 22–32)
Calcium: 10.4 mg/dL — ABNORMAL HIGH (ref 8.9–10.3)
Chloride: 100 mmol/L (ref 98–111)
Creatinine, Ser: 1.34 mg/dL — ABNORMAL HIGH (ref 0.44–1.00)
GFR, Estimated: 57 mL/min — ABNORMAL LOW (ref >60)
Glucose, Bld: 601 mg/dL (ref 70–99)
Potassium: 5.4 mmol/L — ABNORMAL HIGH (ref 3.5–5.1)
Sodium: 135 mmol/L (ref 135–145)
Total Bilirubin: 1.1 mg/dL (ref 0.0–1.2)
Total Protein: 9.9 g/dL — ABNORMAL HIGH (ref 6.5–8.1)

## 2023-05-10 LAB — URINALYSIS, ROUTINE W REFLEX MICROSCOPIC
Bilirubin Urine: NEGATIVE
Glucose, UA: >=500 mg/dL — AB
Ketones, ur: 5 mg/dL — AB
Nitrite: NEGATIVE
Protein, ur: NEGATIVE mg/dL
Specific Gravity, Urine: 1 — ABNORMAL LOW (ref 1.005–1.030)
pH: 6 (ref 5.0–8.0)

## 2023-05-10 LAB — LIPASE, BLOOD: Lipase: 20 U/L (ref 11–51)

## 2023-05-10 LAB — I-STAT VENOUS BLOOD GAS, ED
Acid-base deficit: 20 mmol/L — ABNORMAL HIGH (ref 0.0–2.0)
Bicarbonate: 6 mmol/L — ABNORMAL LOW (ref 20.0–28.0)
Calcium, Ion: 1.14 mmol/L — ABNORMAL LOW (ref 1.15–1.40)
HCT: 48 % — ABNORMAL HIGH (ref 36.0–46.0)
Hemoglobin: 16.3 g/dL — ABNORMAL HIGH (ref 12.0–15.0)
O2 Saturation: 99 %
Potassium: 5.4 mmol/L — ABNORMAL HIGH (ref 3.5–5.1)
Sodium: 132 mmol/L — ABNORMAL LOW (ref 135–145)
TCO2: 6 mmol/L — ABNORMAL LOW (ref 22–32)
pCO2, Ven: 16.9 mmHg — CL (ref 44–60)
pH, Ven: 7.154 — CL (ref 7.25–7.43)
pO2, Ven: 161 mmHg — ABNORMAL HIGH (ref 32–45)

## 2023-05-10 LAB — I-STAT ARTERIAL BLOOD GAS, ED
Acid-base deficit: 9 mmol/L — ABNORMAL HIGH (ref 0.0–2.0)
Bicarbonate: 15.3 mmol/L — ABNORMAL LOW (ref 20.0–28.0)
Calcium, Ion: 1.33 mmol/L (ref 1.15–1.40)
HCT: 39 % (ref 36.0–46.0)
Hemoglobin: 13.3 g/dL (ref 12.0–15.0)
O2 Saturation: 98 %
Patient temperature: 36.6
Potassium: 3.8 mmol/L (ref 3.5–5.1)
Sodium: 134 mmol/L — ABNORMAL LOW (ref 135–145)
TCO2: 16 mmol/L — ABNORMAL LOW (ref 22–32)
pCO2 arterial: 26.9 mmHg — ABNORMAL LOW (ref 32–48)
pH, Arterial: 7.36 (ref 7.35–7.45)
pO2, Arterial: 114 mmHg — ABNORMAL HIGH (ref 83–108)

## 2023-05-10 LAB — HCG, SERUM, QUALITATIVE: Preg, Serum: NEGATIVE

## 2023-05-10 LAB — BETA-HYDROXYBUTYRIC ACID: Beta-Hydroxybutyric Acid: >8 mmol/L — ABNORMAL HIGH (ref 0.05–0.27)

## 2023-05-10 LAB — GROUP A STREP BY PCR: Group A Strep by PCR: NOT DETECTED

## 2023-05-10 MED ORDER — LACTATED RINGERS IV BOLUS: 20.0000 mL/kg | Freq: Once | INTRAVENOUS | Status: DC

## 2023-05-10 MED ORDER — ACETAMINOPHEN 325 MG PO TABS
650.0000 mg | ORAL_TABLET | Freq: Four times a day (QID) | ORAL | Status: DC | PRN
  Administered 2023-05-10: 650 mg via ORAL
  Filled 2023-05-10: qty 2

## 2023-05-10 MED ORDER — LACTATED RINGERS IV SOLN: INTRAVENOUS | Status: AC

## 2023-05-10 MED ORDER — NYSTATIN 100000 UNIT/ML MT SUSP
5.0000 mL | Freq: Four times a day (QID) | OROMUCOSAL | Status: DC
  Administered 2023-05-11 – 2023-05-12 (×4): 500000 [IU] via ORAL
  Filled 2023-05-10 (×4): qty 5

## 2023-05-10 MED ORDER — SODIUM ZIRCONIUM CYCLOSILICATE 10 G PO PACK: 10.0000 g | PACK | Freq: Once | ORAL | Status: DC

## 2023-05-10 MED ORDER — LACTATED RINGERS IV SOLN: INTRAVENOUS | Status: DC

## 2023-05-10 MED ORDER — DEXTROSE 50 % IV SOLN
0.0000 mL | INTRAVENOUS | Status: DC | PRN
Start: 2023-05-10 — End: 2023-05-12

## 2023-05-10 MED ORDER — INSULIN REGULAR(HUMAN) IN NACL 100-0.9 UT/100ML-% IV SOLN
INTRAVENOUS | Status: DC
  Administered 2023-05-10: 6 [IU]/h via INTRAVENOUS
  Administered 2023-05-10: 2.8 [IU]/h via INTRAVENOUS
  Administered 2023-05-10: 10.5 [IU]/h via INTRAVENOUS
  Filled 2023-05-10 (×2): qty 100

## 2023-05-10 MED ORDER — ACETAMINOPHEN 650 MG RE SUPP: 650.0000 mg | Freq: Four times a day (QID) | RECTAL | Status: DC | PRN

## 2023-05-10 MED ORDER — RIVAROXABAN 10 MG PO TABS
10.0000 mg | ORAL_TABLET | Freq: Every day | ORAL | Status: DC
  Administered 2023-05-11: 10 mg via ORAL
  Filled 2023-05-10 (×2): qty 1

## 2023-05-10 MED ORDER — LACTATED RINGERS IV BOLUS
500.0000 mL | Freq: Once | INTRAVENOUS | Status: AC
  Administered 2023-05-10: 500 mL via INTRAVENOUS

## 2023-05-10 MED ORDER — DEXTROSE IN LACTATED RINGERS 5 % IV SOLN: INTRAVENOUS | Status: DC

## 2023-05-10 MED ORDER — LACTATED RINGERS IV BOLUS
1000.0000 mL | Freq: Once | INTRAVENOUS | Status: AC
  Administered 2023-05-10: 1000 mL via INTRAVENOUS

## 2023-05-10 MED ORDER — ONDANSETRON HCL 4 MG/2ML IJ SOLN
4.0000 mg | Freq: Once | INTRAMUSCULAR | Status: AC | PRN
  Administered 2023-05-10: 4 mg via INTRAVENOUS
  Filled 2023-05-10: qty 2

## 2023-05-10 NOTE — ED Provider Notes (Signed)
 [facility] EMERGENCY DEPARTMENT AT [facility] Provider Note   CSN: [id number] Arrival date & time: 05/10/23  5784     History  Chief Complaint  Patient presents with   Emesis    Marisa Long is a 23 y.o. female.   Emesis    Patient presents to the ED with complaints of nausea and vomiting unable to keep anything down other than water for the last 6 days.  Patient states she has had multiple episodes of nausea and vomiting.  She has had polyuria and polydipsia.  She has been losing a lot of weight over the last several days.  She tried over-the-counter flu medications.  Patient states she has significant sore throat.  She has some discomfort in her chest as well.  Home Medications Prior to Admission medications   Medication Sig Start Date End Date Taking? Authorizing Provider  LO LOESTRIN FE 1 MG-10 MCG / 10 MCG tablet Take 1 tablet by mouth daily. 11/01/22   Hermina Staggers, MD      Allergies    Patient has no known allergies.    Review of Systems   Review of Systems  Gastrointestinal:  Positive for vomiting.    Physical Exam Updated Vital Signs BP 123/77 (BP Location: Right Arm)   Pulse 70   Temp 97.7 F (36.5 C) (Oral)   Resp (!) 22   Wt 72.6 kg   LMP  (LMP Unknown)   SpO2 100%   BMI 29.26 kg/m  Physical Exam Vitals and nursing note reviewed.  Constitutional:      General: She is in acute distress.     Appearance: She is well-developed. She is ill-appearing.  HENT:     Head: Normocephalic and atraumatic.     Right Ear: External ear normal.     Left Ear: External ear normal.     Mouth/Throat:     Mouth: Mucous membranes are dry.     Comments: Mucous membranes are dry Eyes:     General: No scleral icterus.       Right eye: No discharge.        Left eye: No discharge.     Conjunctiva/sclera: Conjunctivae normal.     Comments: Eyes are sunken  Neck:     Trachea: No tracheal deviation.  Cardiovascular:     Rate and Rhythm: Normal rate  and regular rhythm.  Pulmonary:     Effort: Pulmonary effort is normal. No respiratory distress.     Breath sounds: Normal breath sounds. No stridor. No wheezing or rales.  Abdominal:     General: Bowel sounds are normal. There is no distension.     Palpations: Abdomen is soft.     Tenderness: There is no abdominal tenderness. There is no guarding or rebound.  Musculoskeletal:        General: No tenderness or deformity.     Cervical back: Neck supple.  Skin:    General: Skin is warm and dry.     Findings: No rash.  Neurological:     General: No focal deficit present.     Mental Status: She is alert.     Cranial Nerves: No cranial nerve deficit, dysarthria or facial asymmetry.     Sensory: No sensory deficit.     Motor: No abnormal muscle tone or seizure activity.     Coordination: Coordination normal.  Psychiatric:        Mood and Affect: Mood normal.     ED Results /  Procedures / Treatments   Labs (all labs ordered are listed, but only abnormal results are displayed) Labs Reviewed  CBC - Abnormal; Notable for the following components:      Result Value   WBC 11.2 (*)    Platelets 456 (*)    All other components within normal limits  URINALYSIS, ROUTINE W REFLEX MICROSCOPIC - Abnormal; Notable for the following components:   Color, Urine COLORLESS (*)    Specific Gravity, Urine 1.000 (*)    Glucose, UA >=500 (*)    Hgb urine dipstick MODERATE (*)    Ketones, ur 5 (*)    Leukocytes,Ua TRACE (*)    Bacteria, UA RARE (*)    All other components within normal limits  BETA-HYDROXYBUTYRIC ACID - Abnormal; Notable for the following components:   Beta-Hydroxybutyric Acid >8.00 (*)    All other components within normal limits  I-STAT CHEM 8, ED - Abnormal; Notable for the following components:   Sodium 132 (*)    Potassium 5.4 (*)    BUN 22 (*)    Glucose, Bld 603 (*)    Calcium, Ion 1.12 (*)    TCO2 8 (*)    Hemoglobin 16.7 (*)    HCT 49.0 (*)    All other components  within normal limits  I-STAT VENOUS BLOOD GAS, ED - Abnormal; Notable for the following components:   pH, Ven 7.154 (*)    pCO2, Ven 16.9 (*)    pO2, Ven 161 (*)    Bicarbonate 6.0 (*)    TCO2 6 (*)    Acid-base deficit 20.0 (*)    Sodium 132 (*)    Potassium 5.4 (*)    Calcium, Ion 1.14 (*)    HCT 48.0 (*)    Hemoglobin 16.3 (*)    All other components within normal limits  CBG MONITORING, ED - Abnormal; Notable for the following components:   Glucose-Capillary 569 (*)    All other components within normal limits  RESP PANEL BY RT-PCR (RSV, FLU A&B, COVID)  RVPGX2  GROUP A STREP BY PCR  HCG, SERUM, QUALITATIVE  COMPREHENSIVE METABOLIC PANEL  LIPASE, BLOOD    EKG EKG Interpretation Date/Time:  Wednesday May 10 2023 06:48:22 EST Ventricular Rate:  71 PR Interval:  126 QRS Duration:  84 QT Interval:  410 QTC Calculation: 445 R Axis:   89  Text Interpretation: Normal sinus rhythm with sinus arrhythmia Biatrial enlargement Abnormal ECG No previous ECGs available Confirmed by Linwood Dibbles [phone]) on 05/10/2023 8:18:12 AM  Radiology DG Chest Portable 1 View Result Date: 05/10/2023 CLINICAL DATA:  Chest pain EXAM: PORTABLE CHEST 1 VIEW COMPARISON:  02/07/2019 FINDINGS: The heart size and mediastinal contours are within normal limits. Both lungs are clear. The visualized skeletal structures are unremarkable. IMPRESSION: No active disease. Electronically Signed   By: Judie Petit.  Shick M.D.   On: 05/10/2023 08:40    Procedures .Critical Care  Performed by: Linwood Dibbles, MD Authorized by: Linwood Dibbles, MD   Critical care provider statement:    Critical care time (minutes):  30   Critical care was time spent personally by me on the following activities:  Development of treatment plan with patient or surrogate, discussions with consultants, evaluation of patient's response to treatment, examination of patient, ordering and review of laboratory studies, ordering and review of radiographic  studies, ordering and performing treatments and interventions, pulse oximetry, re-evaluation of patient's condition and review of old charts     Medications Ordered in ED Medications  insulin regular, human (MYXREDLIN) 100 units/ 100 mL infusion (10.5 Units/hr Intravenous Rate/Dose Verify 05/10/23 0924)  dextrose 50 % solution 0-50 mL (has no administration in time range)  lactated ringers infusion (has no administration in time range)  dextrose 5 % in lactated ringers infusion (has no administration in time range)  lactated ringers bolus 500 mL (has no administration in time range)  ondansetron (ZOFRAN) injection 4 mg (4 mg Intravenous Given 05/10/23 0626)  lactated ringers bolus 1,000 mL (1,000 mLs Intravenous New Bag/Given 05/10/23 0749)    ED Course/ Medical Decision Making/ A&P Clinical Course as of 05/10/23 0931  Wed May 10, 2023  0804 I-Stat venous blood gas, ED(!!) Venous blood gas shows pH of 7.1 decreased pCO2 consistent with a metabolic acidosis [JK]  0805 I-STAT Chem-8 shows hyperglycemia decreased bicarb suggestive of DKA [JK]  0805 CBC(!) White blood cell count elevated [JK]  0915 Urinalysis, Routine w reflex microscopic -Urine, Clean Catch(!) Urinalysis not suggestive of infection [JK]  0916 Chest x-ray without acute findings [JK]    Clinical Course User Index [JK] Linwood Dibbles, MD                                 Medical Decision Making Patient's initial screening labs at triage are consistent with diabetic ketoacidosis.  Will assess for possible triggers including streptococcal pharyngitis, pneumonia, hepatitis, pancreatitis, UTI, pyelonephritis  Problems Addressed: Diabetic ketoacidosis without coma associated with type 1 diabetes mellitus (HCC): acute illness or injury that poses a threat to life or bodily functions  Amount and/or Complexity of Data Reviewed Labs: ordered. Decision-making details documented in ED Course. Radiology: ordered and independent  interpretation performed.  Risk Prescription drug management. Drug therapy requiring intensive monitoring for toxicity. Decision regarding hospitalization.   Patient presented to the ED for evaluation of nausea vomiting polyuria polydipsia.  Patient also has been complaining of sore throat.  Patient noted to be dehydrated on exam.  She had sunken eyes very dry mucous membranes.  The patient's laboratory tests are consistent with diabetic ketoacidosis.  No evidence of streptococcal pharyngitis.  COVID flu RSV negative.  Chest x-ray without pneumonia.  Urinalysis does not suggest infection.  No signs of bacterial infection precipitating her DKA.  Patient was not aware that she had diabetes.  Believe this is new onset diabetes with first-time diabetic ketoacidosis.  Patient has been started on IV fluids and IV insulin.  She has been given antiemetics.  I will consult the medical service for admission to the hospital for further treatment.        Final Clinical Impression(s) / ED Diagnoses Final diagnoses:  Diabetic ketoacidosis without coma associated with type 1 diabetes mellitus [facility])    Rx / DC Orders ED Discharge Orders     None         Linwood Dibbles, MD 05/10/23 [phone]

## 2023-05-10 NOTE — ED Notes (Signed)
 Delay in patient getting room from EMS triage, ED PA at bedside to [facility] patient.

## 2023-05-10 NOTE — Telephone Encounter (Signed)
 Patient Product/process development scientist completed.    The patient is insured through [facility].     Ran test claim for Lantus Pen and the current 30 day co-pay is $4.00.  Ran test claim for Novolog FlexPen and the current 30 day co-pay is $4.00.  Ran test claim for Dexcom G7 Sensor and Requires Prior Authorization  Ran test claim for [facility] 3 Sensor and Requires Prior Authorization  This test claim was processed through [employer]- copay amounts may vary at other pharmacies due to [employer], or as the patient moves through the different stages of their insurance plan.     Roland Earl, CPHT Pharmacy Technician III Certified Patient Advocate [facility] Patient Advocate Team Direct Number: [phone]  Fax: [phone]

## 2023-05-10 NOTE — Inpatient Diabetes Management (Signed)
 Inpatient Diabetes Program Recommendations  AACE/ADA: New Consensus Statement on Inpatient Glycemic Control (2015)  Target Ranges:  Prepandial:   less than 140 mg/dL      Peak postprandial:   less than 180 mg/dL (1-2 hours)      Critically ill patients:  140 - 180 mg/dL   Lab Results  Component Value Date   GLUCAP 481 (H) 05/10/2023    Latest Reference Range & Units Most Recent  Sodium 135 - 145 mmol/L 135 05/10/23 07:53  Potassium 3.5 - 5.1 mmol/L 5.4 (H) 05/10/23 07:53  Chloride 98 - 111 mmol/L 100 05/10/23 07:53  CO2 22 - 32 mmol/L <7 (L) 05/10/23 07:53  Glucose 70 - 99 mg/dL 161 (HH) 0/96/04 54:09  BUN 6 - 20 mg/dL 22 (H) 10/22/89 47:82  Creatinine 0.44 - 1.00 mg/dL 9.56 (H) 04/26/06 65:78  Calcium 8.9 - 10.3 mg/dL 46.9 (H) 09/10/50 84:13  Anion gap 5 - 15  NOT CALCULATED 05/10/23 07:53  (HH): Data is critically high (H): Data is abnormally high (L): Data is abnormally low  Latest Reference Range & Units 05/10/23 07:57  Beta-Hydroxybutyric Acid 0.05 - 0.27 mmol/L >8.00 (H)  (H): Data is abnormally high  Diabetes history: DM1--- No prior hx Current orders for Inpatient glycemic control: IV insulin  Inpatient Diabetes Program Recommendations:   Spoke with patient @ bedside in ED. Started discussion about her glucose levels elevated and she is currently receiving IV insulin to bring her blood glucose dose to normal.  Discussed difference in type 1 and type 2 diabetes. Patient's mom currently uses insulin pens but is uncertain if she has type 1 or 2 diabetes. Gave patient copy of type 1 diabetes children's book (adult type 1 not available) and basic nutrition handout. Patient is still nauseated so will continue teaching in am. Ordered nutrition consult and transition of care (needs PCP). Requested benefit check for insulins (basal & short acting) and CGM.  Thank you, Billy Fischer. Arkeem Harts, RN, MSN, CDCES  Diabetes Coordinator Inpatient Glycemic Control Team Team Pager  [phone] (8am-5pm) 05/10/2023 1:37 PM

## 2023-05-10 NOTE — H&P (Signed)
 Date: 05/10/2023               Patient Name:  Marisa Long MRN: [id number]  DOB: [birth year] Age / Sex: 23 y.o., female   PCP: Pcp, No         Medical Service: Internal Medicine Teaching Service         Attending Physician: Dr. Reymundo Poll, MD    First Contact: Dr. Lovie Macadamia Pager: [phone]  Second Contact: Dr. Champ Mungo Pager: [phone]       After Hours (After 5p/  First Contact Pager: [phone]  weekends / holidays): Second Contact Pager: [phone]   Chief Complaint: DKA  History of Present Illness: Marisa Long is a 23 y.o. F with PMH anxiety who presents with several days to 1 week of nausea, vomiting, polyuria, polydipsia, body aches, chills. She also notes a 20 lb weight loss over the last 2 days.   Her family who is at bedside help provide history. They explain that when she became so weak that she slept all day and had significant weight loss rapidly they were concerned and brought her to the hospital. She has no known sick contacts. No measured fever. She does have pain in her throat as well that started over the last several days-week.  Pertinent labs include WBC 11.2, platelets 456; UA with >500 glucose, moderate Hgb and no RBCs, 5 ketones, trace leukocytes, rare bacteria; iSTAT with Na 132, K 5.4, BUN 22, iCa 1.12, TCO2 8, glucose 603; VBG with pH 7.154, pCO2 16.9, pO2 161, bicarbonate 6; CMP with K 5.4, CO2 <7, BUN/Cr 22/1.34 (normally WNL), Ca 10.4, total protein 9.9, GFR 57; lipase 20; beta-hydroxybutyric acid >8; negative for COVID-19, influenza, RSV; GAS negative. IMTS paged for admission.  Past Medical History: Past Medical History:  Diagnosis Date   Anemia    Anxiety    Past Surgical History: Past Surgical History:  Procedure Laterality Date   TIBIA IM NAIL INSERTION Right 02/07/2019   Procedure: 1.  Excisional debridement of skin, subcutaneous tissue, and bone right tibia. 2.  Intramedullary fixation of right tibial shaft fracture. 3.  Primary  closure of 22 mm traumatic wound and 90 mm traumatic wound. 4.  Application of negative pressure incisional dressing. 5.  Interpretation of fluoroscopic images.;  Surgeon: Samson Frederic, MD;  [facility] OR;  Service: Orthopedics;  Late   Meds:  Current Meds  Medication Sig   Multiple Vitamin (MULTIVITAMIN WITH MINERALS) TABS tablet Take 1 tablet by mouth daily.   Allergies: Allergies as of 05/10/2023   (No Known Allergies)   Family History:  Family History  Problem Relation Age of Onset   Hypertension Mother    Diabetes Mother    Diabetes Maternal Grandfather    Social History: Independent in ADLs and IADLs. No local PCP. Lives with her stepmother and father. Occasionally drinks alcohol, smokes at most 1/2 PPD of cigarettes (started smoking at age 11), denies illicit substance use.   Review of Systems: A complete ROS was negative except as per HPI.   Physical Exam: Blood pressure 132/82, pulse 70, temperature 97.7 F (36.5 C), temperature source Oral, resp. rate (!) 24, weight 72.6 kg, SpO2 92%, not currently breastfeeding. Constitutional:Ill-appearing, resting in bed in no acute distress. HENT: White exudate covers pharynx and posterior tongue. Mucous membranes are dry. Cardio:Regular rate and rhythm. No murmurs, rubs, or gallops. Pulm:Clear to auscultation bilaterally. Normal work of breathing on room air. Abdomen:Soft, nontender, nondistended. MWN:[id number] for extremity edema.  Skin:Warm and dry. Neuro:Alert and oriented to self, time, location. No focal deficit noted. Psych:Pleasant mood and affect.  EKG: NSR, possibly biatrial enlargement.  CXR: No active disease.  Assessment & Plan by Problem: Principal Problem:   Diabetic ketoacidosis (HCC)  Diabetic ketoacidosis No known diagnosis of diabetes per patient report and I do not see prior HbA1c in her chart. Quite acidotic with pH on VBG 7.154, bicarbonate 6, beta hydroxybutyric acid >8. A/G ratio is -1.94 which  raises suspicion of an autoimmune process. She has family history of both type 1 and type 2 diabetes. She is drowsy but wakes easily and is protecting airway. Unclear if nausea and vomiting were due to DKA or was precipitating etiology in the setting of acute illness; negative for COVID-19, influenza, RSV. Plan: -Now on Endotool; not yet ready to transition to subcutaneous insulin -Trend BMP q4h, transition off Endotool when gap <12 x2 -NPO for now -Will check HbA1c, GAD and IA-2 autoantibodies, urine microalbumin/creatinine ratio -Repeat EKG this afternoon -Will consult diabetes educator  Hyperkalemia Mild and in the setting of DKA. Currently on Endotool. EKG stable. Plan: -Trend BMP as above  Acute kidney injury Likely in setting of dehydration from N/V, polyuria. Plan: -Trend BMP -IVF resuscitation -Avoid nephrotoxic agents  Leukocytosis Thrombocytosis Likely in setting of acute inflammation/illness. Plan: -Trend CBC  Hypercalcemia Calcium level minimally elevated. Considered possibility of MEN1 given new diagnosis of diabetes in conjunction with this lab abnormality but suspicion overall is low. Plan: -Trend calcium levels with IVF resuscitation  Throat pain Group A strep test negative. She does have what appears to be white exudate over the posterior tongue and oropharynx which is painful. Concern for oropharyngeal candida in the setting of uncontrolled diabetes. Plan: -Will try nystatin swish and swallow 500,000 units four times daily for 7 day course  Dispo: Admit patient to Inpatient with expected length of stay greater than 2 midnights.  SignedChamp Mungo, DO 05/10/2023, 10:37 AM  After 5pm on weekdays and 1pm on weekends: On Call pager: [phone]

## 2023-05-10 NOTE — Telephone Encounter (Signed)
 Pharmacy Patient Advocate Encounter  Received notification from [facility] that Prior Authorization for [employer] Libre 3 Sensor has been APPROVED from 05/09/2023 to 11/05/2023. Ran test claim, Copay is $0.00. This test claim was processed through [facility]- copay amounts may vary at other pharmacies due to pharmacy/plan contracts, or as the patient moves through the different stages of their insurance plan.   PA #/Case ID/Reference #: [id number] Key: Loetta Rough

## 2023-05-10 NOTE — ED Notes (Signed)
 Critical istat results from VBG and chem 8 reported to EDP. CRN notified of increased urgency for room

## 2023-05-10 NOTE — ED Triage Notes (Addendum)
 Presents from home via EMS for N/V x 6 days , unable now to keep down any liquids.  Endorses CP, sore throat, fever, diarrhea, RUQ abd pain  Tried theraflu without relief  EMS started 18G RA but did not receive signif volume fluid en route as transport time ~49min.  EMS VS110/82, 60bpm, 2L O2 for work of breathing (unable to obtain spO2 reading), peaked T waves  In triage, pt VS are stable; however, lethargic

## 2023-05-10 NOTE — ED Provider Triage Note (Signed)
 Emergency Medicine Provider Triage Evaluation Note  Marisa Long , a 23 y.o. female  was evaluated in triage.  Pt complains of nausea vomiting for 6 days along with lethargy.  Patient has been been having nausea vomiting I will keep any fluids down minus orange juice.  Patient that she is extremely thirsty.  Patient is unsure of fevers.  Patient states she only has abdominal pain she vomits but otherwise has no abdominal pain.  Patient denies sick contacts.  Patient denies any hematemesis.  Review of Systems  Positive:  Negative:   Physical Exam  BP 117/66 (BP Location: Right Arm)   Pulse 65   Temp 97.7 F (36.5 C) (Oral)   Resp 20   Wt 72.6 kg   SpO2 100%   BMI 29.26 kg/m  Gen:   Awake, in distress, lethargic Resp:  Normal effort  MSK:   Moves extremities without difficulty  Other:  No abdominal tenderness, dry mucous membranes, possible exudates  Medical Decision Making  Medically screening exam initiated at 7:45 AM.  Appropriate orders placed.  Marisa Long was informed that the remainder of the evaluation will be completed by another provider, this initial triage assessment does not replace that evaluation, and the importance of remaining in the ED until their evaluation is complete.  Workup started, patient's labs do show hyperglycemia in the 600s along with T CO2 of 8 so I suspect DKA at this time we will start fluids, nurse notified the patient is not safe to go to the waiting room.   Marisa Long, [place] 05/10/23 [phone]

## 2023-05-11 ENCOUNTER — Telehealth: Payer: Self-pay

## 2023-05-11 ENCOUNTER — Other Ambulatory Visit (HOSPITAL_COMMUNITY): Payer: Self-pay

## 2023-05-11 DIAGNOSIS — E1010 Type 1 diabetes mellitus with ketoacidosis without coma: Secondary | ICD-10-CM | POA: Diagnosis not present

## 2023-05-11 LAB — BASIC METABOLIC PANEL
Anion gap: 13 (ref 5–15)
Anion gap: 14 (ref 5–15)
Anion gap: 14 (ref 5–15)
Anion gap: 8 (ref 5–15)
BUN: 15 mg/dL (ref 6–20)
BUN: 15 mg/dL (ref 6–20)
BUN: 16 mg/dL (ref 6–20)
BUN: 17 mg/dL (ref 6–20)
CO2: 16 mmol/L — ABNORMAL LOW (ref 22–32)
CO2: 17 mmol/L — ABNORMAL LOW (ref 22–32)
CO2: 19 mmol/L — ABNORMAL LOW (ref 22–32)
CO2: 19 mmol/L — ABNORMAL LOW (ref 22–32)
Calcium: 9.2 mg/dL (ref 8.9–10.3)
Calcium: 9.4 mg/dL (ref 8.9–10.3)
Calcium: 9.4 mg/dL (ref 8.9–10.3)
Calcium: 9.9 mg/dL (ref 8.9–10.3)
Chloride: 101 mmol/L (ref 98–111)
Chloride: 104 mmol/L (ref 98–111)
Chloride: 105 mmol/L (ref 98–111)
Chloride: 108 mmol/L (ref 98–111)
Creatinine, Ser: 0.7 mg/dL (ref 0.44–1.00)
Creatinine, Ser: 0.72 mg/dL (ref 0.44–1.00)
Creatinine, Ser: 0.74 mg/dL (ref 0.44–1.00)
Creatinine, Ser: 0.78 mg/dL (ref 0.44–1.00)
GFR, Estimated: >60 mL/min (ref >60)
GFR, Estimated: >60 mL/min (ref >60)
GFR, Estimated: >60 mL/min (ref >60)
GFR, Estimated: >60 mL/min (ref >60)
Glucose, Bld: 127 mg/dL — ABNORMAL HIGH (ref 70–99)
Glucose, Bld: 160 mg/dL — ABNORMAL HIGH (ref 70–99)
Glucose, Bld: 193 mg/dL — ABNORMAL HIGH (ref 70–99)
Glucose, Bld: 238 mg/dL — ABNORMAL HIGH (ref 70–99)
Potassium: 3.2 mmol/L — ABNORMAL LOW (ref 3.5–5.1)
Potassium: 3.4 mmol/L — ABNORMAL LOW (ref 3.5–5.1)
Potassium: 3.5 mmol/L (ref 3.5–5.1)
Potassium: 3.9 mmol/L (ref 3.5–5.1)
Sodium: 134 mmol/L — ABNORMAL LOW (ref 135–145)
Sodium: 134 mmol/L — ABNORMAL LOW (ref 135–145)
Sodium: 135 mmol/L (ref 135–145)
Sodium: 135 mmol/L (ref 135–145)

## 2023-05-11 LAB — GLUCOSE, CAPILLARY
Glucose-Capillary: 165 mg/dL — ABNORMAL HIGH (ref 70–99)
Glucose-Capillary: 228 mg/dL — ABNORMAL HIGH (ref 70–99)
Glucose-Capillary: 132 mg/dL — ABNORMAL HIGH (ref 70–99)
Glucose-Capillary: 126 mg/dL — ABNORMAL HIGH (ref 70–99)
Glucose-Capillary: 185 mg/dL — ABNORMAL HIGH (ref 70–99)
Glucose-Capillary: 119 mg/dL — ABNORMAL HIGH (ref 70–99)
Glucose-Capillary: 108 mg/dL — ABNORMAL HIGH (ref 70–99)
Glucose-Capillary: 186 mg/dL — ABNORMAL HIGH (ref 70–99)
Glucose-Capillary: 246 mg/dL — ABNORMAL HIGH (ref 70–99)
Glucose-Capillary: 223 mg/dL — ABNORMAL HIGH (ref 70–99)
Glucose-Capillary: 298 mg/dL — ABNORMAL HIGH (ref 70–99)
Glucose-Capillary: 263 mg/dL — ABNORMAL HIGH (ref 70–99)

## 2023-05-11 LAB — CBC
HCT: 35.9 % — ABNORMAL LOW (ref 36.0–46.0)
Hemoglobin: 12 g/dL (ref 12.0–15.0)
MCH: 28.3 pg (ref 26.0–34.0)
MCHC: 33.4 g/dL (ref 30.0–36.0)
MCV: 84.7 fL (ref 80.0–100.0)
Platelets: 370 10*3/uL (ref 150–400)
RBC: 4.24 MIL/uL (ref 3.87–5.11)
RDW: 15.2 % (ref 11.5–15.5)
WBC: 11.9 10*3/uL — ABNORMAL HIGH (ref 4.0–10.5)
nRBC: 0 % (ref 0.0–0.2)

## 2023-05-11 LAB — BETA-HYDROXYBUTYRIC ACID: Beta-Hydroxybutyric Acid: 1.79 mmol/L — ABNORMAL HIGH (ref 0.05–0.27)

## 2023-05-11 LAB — C-PEPTIDE: C-Peptide: 0.4 ng/mL — ABNORMAL LOW (ref 1.1–4.4)

## 2023-05-11 MED ORDER — INSULIN ASPART 100 UNIT/ML IJ SOLN
0.0000 [IU] | Freq: Three times a day (TID) | INTRAMUSCULAR | Status: DC
  Administered 2023-05-11: 5 [IU] via SUBCUTANEOUS
  Administered 2023-05-11: 3 [IU] via SUBCUTANEOUS
  Administered 2023-05-12: 5 [IU] via SUBCUTANEOUS

## 2023-05-11 MED ORDER — POTASSIUM CHLORIDE CRYS ER 20 MEQ PO TBCR
60.0000 meq | EXTENDED_RELEASE_TABLET | Freq: Once | ORAL | Status: AC
  Administered 2023-05-11: 60 meq via ORAL
  Filled 2023-05-11: qty 3

## 2023-05-11 MED ORDER — POTASSIUM CHLORIDE CRYS ER 20 MEQ PO TBCR: 60.0000 meq | EXTENDED_RELEASE_TABLET | Freq: Once | ORAL | Status: DC

## 2023-05-11 MED ORDER — ONDANSETRON HCL 4 MG/2ML IJ SOLN
4.0000 mg | Freq: Once | INTRAMUSCULAR | Status: AC | PRN
  Administered 2023-05-11: 4 mg via INTRAVENOUS
  Filled 2023-05-11: qty 2

## 2023-05-11 MED ORDER — INSULIN ASPART 100 UNIT/ML IJ SOLN
0.0000 [IU] | Freq: Every day | INTRAMUSCULAR | Status: DC
  Administered 2023-05-11: 3 [IU] via SUBCUTANEOUS

## 2023-05-11 MED ORDER — DEXTROSE IN LACTATED RINGERS 5 % IV SOLN: INTRAVENOUS | Status: DC

## 2023-05-11 MED ORDER — INSULIN GLARGINE-YFGN 100 UNIT/ML ~~LOC~~ SOPN: 15.0000 [IU] | PEN_INJECTOR | SUBCUTANEOUS | Status: DC

## 2023-05-11 MED ORDER — INSULIN GLARGINE 100 UNIT/ML ~~LOC~~ SOLN
15.0000 [IU] | Freq: Every day | SUBCUTANEOUS | Status: DC
  Administered 2023-05-11: 15 [IU] via SUBCUTANEOUS
  Filled 2023-05-11 (×2): qty 0.15

## 2023-05-11 MED ORDER — INSULIN STARTER KIT- PEN NEEDLES (ENGLISH)
1.0000 | Freq: Once | Status: AC
  Administered 2023-05-11: 1
  Filled 2023-05-11: qty 1

## 2023-05-11 MED ORDER — ONDANSETRON 4 MG PO TBDP
4.0000 mg | ORAL_TABLET | Freq: Three times a day (TID) | ORAL | Status: DC | PRN
  Administered 2023-05-11: 4 mg via ORAL
  Filled 2023-05-11 (×2): qty 1

## 2023-05-11 NOTE — Progress Notes (Signed)
  RD consulted for nutrition education regarding diabetes.   Pt reports that she has received carbohydrate counting education from the diabetes coordinator already. Discussed provided handout on tips for counting carbohydrates. Discussed with pt to focus on serving sizes with the corresponding carbohydrate content on food labels. Discussed some phone apps she can get to help count carbohydrates when eating out. Highlighted importance of taking her prescribed insulin regimen with carbohydrate containing foods. Pt had lunch tray in front of her during visit. She reports not ordering this meal and feeling hungry but does not like her current food. RD ordered her chicken and corn on new tray per patient request. Noted in health touch that new meal exceeded carb modified carbohydrate limit but observed that pt did not eat the ice cream or other items on her tray.  Lab Results  Component Value Date   HGBA1C 10.8 (H) 05/10/2023    RD provided "Type 1 Diabetic Pediatric Guide" handout from the Academy of Nutrition and Dietetics. Discussed different food groups and their effects on blood sugar, emphasizing carbohydrate-containing foods. Provided list of carbohydrates and recommended serving sizes of common foods.  Discussed importance of controlled and consistent carbohydrate intake throughout the day. Provided examples of ways to balance meals/snacks and encouraged intake of high-fiber, whole grain complex carbohydrates. Teach back method used.  Expect good compliance.  Body mass index is 23.32 kg/m.   Current diet order is carb modified. Labs and medications reviewed. No further nutrition interventions warranted at this time. RD contact information provided. If additional nutrition issues arise, please re-consult RD.  Maceo Pro, MS Dietetic Intern

## 2023-05-11 NOTE — Inpatient Diabetes Management (Addendum)
 Inpatient Diabetes Program Recommendations  AACE/ADA: New Consensus Statement on Inpatient Glycemic Control (2015)  Target Ranges:  Prepandial:   less than 140 mg/dL      Peak postprandial:   less than 180 mg/dL (1-2 hours)      Critically ill patients:  140 - 180 mg/dL   Lab Results  Component Value Date   GLUCAP 119 (H) 05/11/2023   HGBA1C 10.8 (H) 05/10/2023    Latest Reference Range & Units Most Recent  Sodium 135 - 145 mmol/L 134 (L) 05/11/23 04:25  Potassium 3.5 - 5.1 mmol/L 3.2 (L) 05/11/23 04:25  Chloride 98 - 111 mmol/L 101 05/11/23 04:25  CO2 22 - 32 mmol/L 19 (L) 05/11/23 04:25  Glucose 70 - 99 mg/dL 829 (H) 5/62/13 08:65  Mean Plasma Glucose mg/dL 784.69 09/10/50 84:13  BUN 6 - 20 mg/dL 16 2/44/01 02:72  Creatinine 0.44 - 1.00 mg/dL 5.36 6/44/03 47:42  Calcium 8.9 - 10.3 mg/dL 9.9 5/95/63 87:56  Anion gap 5 - 15  14 05/11/23 04:25  (L): Data is abnormally low (H): Data is abnormally high  Latest Reference Range & Units 05/10/23 16:50  C-Peptide 1.1 - 4.4 ng/mL 0.4 (L)  (L): Data is abnormally low  Diabetes history: New Onset DM1 Current orders for Inpatient glycemic control: IV insulin  Inpatient Diabetes Program Recommendations:   Ordered insulin pen starter kit. Nurses, please begin diabetes education and allow pt. To give own injections. I plan to teach insulin pen today along with diabetes education. Patient stated yesterday that her mom takes insulin via insulin pens and she is currently living with her mom and dad.  Please consider when ready to transition to subcutaneous insulin: -Lantus 17 units daily (give first dose 2 hrs. Prior to D/C of insulin drip) (0.3 units/kg. X 57.8 kg) -Add Novolog 0-9 units q 4 hrs. -Beta-hydroxybutyric acid if not ordered since admission   Latest Reference Range & Units 05/11/23 12:17  Beta-Hydroxybutyric Acid 0.05 - 0.27 mmol/L 1.79 (H)  (H): Data is abnormally high 2:45 pm:  Since patient is eating, please  consider: -Add Novolog 3 units tid meal coverage if eats 50% -Change Novolog correction to q 4 hrs. Since patient has type 1. Sent [place] to Faith Rogue MD.  Met with patient @ bedside.  Educated patient on insulin pen use at home. Reviewed contents of insulin flexpen starter kit. Reviewed all steps if insulin pen including attachment of needle, 2-unit air shot, dialing up dose, giving injection, removing needle, disposal of sharps, storage of unused insulin, disposal of insulin etc. Patient able to provide successful return demonstration. Also reviewed troubleshooting with insulin pen. MD to give patient Rxs for insulin pens and insulin pen needles.   Applied Dexcom G7 CGM at discharge for patient. Education done regarding application and changing CGM sensor (alternate every 10 days on back of arms), 24 minute warm-up, use of glucometer when alert displays, how to scan CGM for glucose reading and information for PCP. Patient has also been given educational packet regarding use CGM sensor including the 1-800 toll free number for any questions, problems or needs related to the [facility] G7 [place] sensors or reader.    Sensor applied by patient to (R) Arm at 3:00pm.  Explained that glucose readings will not be available until 24 minutes after application. Reviewed use of CGM including how to scan, arrows with glucose readings, and Dexcom app. Patient very appreciative.  Patient's phone was not compatible with Libre 3 CGM but is compatible for  Dexcom G7 and prior authorization has been completed and shows $0 copay. Gave patient additional 2 sensors for home use.  Thank you, Billy Fischer. Amel Gianino, RN, MSN, CDCES  Diabetes Coordinator Inpatient Glycemic Control Team Team Pager [phone] (8am-5pm) 05/11/2023 9:24 AM

## 2023-05-11 NOTE — Telephone Encounter (Signed)
 Pharmacy Patient Advocate Encounter   Received notification from  inpatient message  that prior authorization for Dexcom g7 sensor is required/requested.   Insurance verification completed.   The patient is insured through [facility] .   Per test claim: PA required; PA submitted to above mentioned insurance via CoverMyMeds Key/confirmation #/EOC [id number] Status is pending

## 2023-05-11 NOTE — Inpatient Diabetes Management (Signed)
 Inpatient Diabetes Program Recommendations  AACE/ADA: New Consensus Statement on Inpatient Glycemic Control (2015)  Target Ranges:  Prepandial:   less than 140 mg/dL      Peak postprandial:   less than 180 mg/dL (1-2 hours)      Critically ill patients:  140 - 180 mg/dL   Lab Results  Component Value Date   GLUCAP 223 (H) 05/11/2023   HGBA1C 10.8 (H) 05/10/2023   Discharge Recommendations: Other recommendations: Dexcom G7 CGM 829562 Long acting recommendations: Insulin Glargine (LANTUS) Solostar Pen 17 units daily  Short acting recommendations:  Meal + Correction coverage Insulin aspart (NOVOLOG) FlexPen  Sensitive Scale.  4 units tid meal coverae if eats 50% meal Hypoglycemia treatment recommendations: GVoke 1mg  Supply/Referral recommendations: Glucometer Test strips Lancet device Lancets Pen needles - standard   Use Adult Diabetes Insulin Treatment Post Discharge order set.  Thank you, Billy Fischer. Evalina Tabak, RN, MSN, CDCES  Diabetes Coordinator Inpatient Glycemic Control Team Team Pager [phone] (8am-5pm) 05/11/2023 3:34 PM

## 2023-05-11 NOTE — Telephone Encounter (Signed)
 Pharmacy Patient Advocate Encounter  Received notification from [facility] that Prior Authorization for Dexcom G7 has been APPROVED from 05/11/23 to 11/07/23   PA #/Case ID/Reference #: [id number]

## 2023-05-11 NOTE — Progress Notes (Addendum)
 Subjective: Marisa Long is a 23 y.o. F with PMH anxiety who presents with several days to 1 week of nausea, vomiting, polyuria, polydipsia, body aches, chills and is admitted for DKA.  Today, she reports feeling better than yesterday and is hungry. She denies abdominal pain and only feels nauseated when she has to swallow the potassium pills.  Patient reported that her grandmother has DM and is not sure if it was T1DM or not. She denies family or personal history of autoimmune disease.   Objective:  Vital signs in last 24 hours: Vitals:   05/10/23 1945 05/11/23 0015 05/11/23 0346 05/11/23 0847  BP: 106/68 121/78 (!) 105/58 108/70  Pulse: 75 73 60   Resp: 16 18 16 16   Temp: 97.9 F (36.6 C) 98.1 F (36.7 C) 98.2 F (36.8 C) 98.4 F (36.9 C)  TempSrc: Oral Oral Oral Oral  SpO2: 100% 100% 98%   Weight:      Height:       Physical Exam: General:NAD, resting in bed with family at bedside  Cardiac:RRR, no murmur appreciated  Pulmonary:clear to auscultate bilaterally, normal effort on room air  Abdominal:soft, non-tender  Neuro:awake, alert, participating in conversation  Skin:warm and dry  Psych:  normal mood and affect      Latest Ref Rng & Units 05/11/2023    4:25 AM 05/10/2023    2:19 PM 05/10/2023    7:48 AM  CBC  WBC 4.0 - 10.5 K/uL 11.9     Hemoglobin 12.0 - 15.0 g/dL 86.5  78.4  69.6   Hematocrit 36.0 - 46.0 % 35.9  39.0  48.0   Platelets 150 - 400 K/uL 370          Latest Ref Rng & Units 05/11/2023    4:25 AM 05/10/2023   11:47 PM 05/10/2023    8:07 PM  BMP  Glucose 70 - 99 mg/dL 295  284  132   BUN 6 - 20 mg/dL 16  17  16    Creatinine 0.44 - 1.00 mg/dL 4.40  1.02  7.25   Sodium 135 - 145 mmol/L 134  135  136   Potassium 3.5 - 5.1 mmol/L 3.2  3.4  3.6   Chloride 98 - 111 mmol/L 101  108  105   CO2 22 - 32 mmol/L 19  19  18    Calcium 8.9 - 10.3 mg/dL 9.9  9.4  9.9      Assessment/Plan:  Principal Problem:   Diabetic ketoacidosis (HCC)  Diabetic  ketoacidosis Patient is appears well today and is symptomatically feeling better. Her last two gap readings have been 8 and 14. Based on patient's symptoms and the gap being less than 15 2x, patient was transitioned to subcutaneous insulin this morning.  Plan: -Transitioning off endotool this morning  -15 units lantus started, SSI thin started  -CBG 4x daily with meals and bed time  -Trend BMP q4h -Carb modified diet started  -urine microalbumin/creatinine ratio ordered  -Consulted diabetes educator -D/c D5LR infusion  -C-peptide is 0.4, GAD AB in process, IA-2 Autoantibodies are in process   Acute kidney injury Resolved, was likely in setting of dehydration from N/V, polyuria.  Leukocytosis Thrombocytosis Likely in setting of acute inflammation/illness. Plan: -Trend CBC   Throat pain Group A strep test negative. Concern for oropharyngeal candida in the setting of uncontrolled diabetes. Plan: -Will try nystatin swish and swallow 500,000 units four times daily for 7 day course   Resolved Problems:  Hyperkalemia  Hypercalcemia  AKI __________________________________  Code Status: Full VTE Prophylaxis:Xarelto  Diet:NPO IVF:D5LR Barriers to Discharge:Treatment of DKA Dispo: Anticipated discharge in approximately 1 day(s).   Faith Rogue, DO 05/11/2023, 10:51 AM Pager: [phone] After 5pm on weekdays and 1pm on weekends: On Call pager [phone]
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
## 2023-05-11 NOTE — Progress Notes (Signed)
 CSW spoke with patient at bedside. CSW offered patient transportation resources and [employer] Patient accepted both resources. All questions answered. No further questions reported at this time.

## 2023-05-11 NOTE — Progress Notes (Signed)
 Phlebotomy called and reminded about BMP that was due at 0800.  Lidia Collum, RN

## 2023-05-12 ENCOUNTER — Other Ambulatory Visit (HOSPITAL_COMMUNITY): Payer: Self-pay

## 2023-05-12 DIAGNOSIS — E1010 Type 1 diabetes mellitus with ketoacidosis without coma: Secondary | ICD-10-CM | POA: Diagnosis not present

## 2023-05-12 LAB — GLUCOSE, CAPILLARY: Glucose-Capillary: 296 mg/dL — ABNORMAL HIGH (ref 70–99)

## 2023-05-12 LAB — BASIC METABOLIC PANEL
Anion gap: 11 (ref 5–15)
BUN: 14 mg/dL (ref 6–20)
CO2: 20 mmol/L — ABNORMAL LOW (ref 22–32)
Calcium: 8.8 mg/dL — ABNORMAL LOW (ref 8.9–10.3)
Chloride: 102 mmol/L (ref 98–111)
Creatinine, Ser: 0.74 mg/dL (ref 0.44–1.00)
GFR, Estimated: >60 mL/min (ref >60)
Glucose, Bld: 317 mg/dL — ABNORMAL HIGH (ref 70–99)
Potassium: 3.7 mmol/L (ref 3.5–5.1)
Sodium: 133 mmol/L — ABNORMAL LOW (ref 135–145)

## 2023-05-12 LAB — CBC
HCT: 34.6 % — ABNORMAL LOW (ref 36.0–46.0)
Hemoglobin: 11.4 g/dL — ABNORMAL LOW (ref 12.0–15.0)
MCH: 28.1 pg (ref 26.0–34.0)
MCHC: 32.9 g/dL (ref 30.0–36.0)
MCV: 85.4 fL (ref 80.0–100.0)
Platelets: 289 10*3/uL (ref 150–400)
RBC: 4.05 MIL/uL (ref 3.87–5.11)
RDW: 15.4 % (ref 11.5–15.5)
WBC: 6.5 10*3/uL (ref 4.0–10.5)
nRBC: 0 % (ref 0.0–0.2)

## 2023-05-12 LAB — MAGNESIUM: Magnesium: 1.7 mg/dL (ref 1.7–2.4)

## 2023-05-12 MED ORDER — PEN NEEDLES 32G X 4 MM MISC
0 refills | Status: DC
  Filled 2023-05-12: qty 100, 30d supply, fill #0

## 2023-05-12 MED ORDER — BLOOD GLUCOSE TEST VI STRP
1.0000 | ORAL_STRIP | Freq: Three times a day (TID) | 0 refills | Status: DC
  Filled 2023-05-12: qty 90, 30d supply, fill #0

## 2023-05-12 MED ORDER — INSULIN GLARGINE 100 UNIT/ML SOLOSTAR PEN
18.0000 [IU] | PEN_INJECTOR | Freq: Every day | SUBCUTANEOUS | 11 refills | Status: DC
  Filled 2023-05-12: qty 9, 33d supply, fill #0
  Filled 2023-06-26: qty 9, 33d supply, fill #1
  Filled 2023-08-31: qty 6, 33d supply, fill #0

## 2023-05-12 MED ORDER — INSULIN ASPART 100 UNIT/ML FLEXPEN
4.0000 [IU] | PEN_INJECTOR | Freq: Three times a day (TID) | SUBCUTANEOUS | 0 refills | Status: DC
  Filled 2023-05-12: qty 15, 30d supply, fill #0

## 2023-05-12 MED ORDER — INSULIN GLARGINE 100 UNIT/ML ~~LOC~~ SOLN
18.0000 [IU] | Freq: Every day | SUBCUTANEOUS | Status: DC
  Administered 2023-05-12: 18 [IU] via SUBCUTANEOUS
  Filled 2023-05-12: qty 0.18

## 2023-05-12 MED ORDER — ACCU-CHEK SOFTCLIX LANCETS MISC
1.0000 | Freq: Three times a day (TID) | 0 refills | Status: AC
  Filled 2023-05-12: qty 100, 30d supply, fill #0

## 2023-05-12 MED ORDER — GVOKE HYPOPEN 2-PACK 1 MG/0.2ML ~~LOC~~ SOAJ
1.0000 | SUBCUTANEOUS | 3 refills | Status: AC | PRN
  Filled 2023-05-12: qty 0.4, 30d supply, fill #0

## 2023-05-12 MED ORDER — DEXCOM G7 RECEIVER DEVI
1.0000 | Freq: Once | 0 refills | Status: AC
  Filled 2023-05-12: qty 1, 365d supply, fill #0

## 2023-05-12 MED ORDER — LANCET DEVICE MISC
1.0000 | Freq: Three times a day (TID) | 0 refills | Status: AC
  Filled 2023-05-12: qty 1, 30d supply, fill #0

## 2023-05-12 MED ORDER — DEXCOM G7 SENSOR MISC
0 refills | Status: DC
  Filled 2023-05-12: qty 3, 30d supply, fill #0

## 2023-05-12 MED ORDER — BLOOD GLUCOSE MONITOR SYSTEM W/DEVICE KIT
1.0000 | PACK | Freq: Three times a day (TID) | 0 refills | Status: DC
  Filled 2023-05-12: qty 1, 30d supply, fill #0

## 2023-05-12 MED ORDER — NYSTATIN 100000 UNIT/ML MT SUSP
5.0000 mL | Freq: Four times a day (QID) | OROMUCOSAL | 0 refills | Status: AC
  Filled 2023-05-12: qty 60, 3d supply, fill #0

## 2023-05-12 NOTE — Plan of Care (Signed)

## 2023-05-12 NOTE — Discharge Instructions (Addendum)
 You came to the hospital for diabetes ketoacidosis.  We treated you with insulin.    *For your Diabetes  -We have started you on these following medications:  -Lantus, this is a long acting insulin that you will inject 18 units every morning (once a day)  -Novolog (short acting with meals), inject 4 units with every meal (three times a day)  -Please check your blood sugar with your free style libre, if you blood sugars are less than 70, please call the phone number provided below  -We have set you up with our internal medicine clinic: Your appointment is at 9:45 on March 19th -We have also set you an appointment with our diabetes coordinator on March 19th at 8:15 in the morning, these are located in the same office   If you have any questions or concerns please feel free to call: Internal medicine clinic at [phone]  *Please use Nystatin 4 times a day until March 4th   If you have any of these following symptoms, please call us or seek care at an emergency department: -Chest Pain -Difficulty Breathing -Syncope (passing out) -Drooping of face -Slurred speech -Sudden weakness in your leg or arm -Fever -Chills -Blood sugars less than 70    We are glad that you are feeling better, it was a pleasure to care for you!  Faith Rogue DO

## 2023-05-12 NOTE — Progress Notes (Signed)
 Discharge instructions (including medications) discussed with and copy provided to patient/caregiver. Patient verbalized understanding.  PIV already removed and patient had dressed herself.  TOC medications are ready and patient walked with nursing student to pick up medications at [facility] pharmacy.

## 2023-05-12 NOTE — Discharge Summary (Addendum)
 Name: Marisa Long MRN: [id number] DOB: 2000-07-03 23 y.o. PCP: Pcp, No  Date of Admission: 05/10/2023  6:06 AM Date of Discharge: 05/12/2023 11:13 AM Attending Physician: Dr. Antony Contras  Discharge Diagnosis: Principal Problem:   Diabetic ketoacidosis [facility])    Discharge Medications: Allergies as of 05/12/2023   No Known Allergies      Medication List     TAKE these medications    Blood Glucose Monitoring Suppl Devi 1 each by Does not apply route in the morning, at noon, and at bedtime. May substitute to any manufacturer covered by patient's insurance.   BLOOD GLUCOSE TEST STRIPS Strp 1 each by In Vitro route in the morning, at noon, and at bedtime. May substitute to any manufacturer covered by patient's insurance.   Dexcom G7 Receiver Devi 1 each by Does not apply route once for 1 dose.   Dexcom G7 Sensor Misc Please replace the skin sensor every 10 days   Gvoke Kit 1 MG/0.2ML Soln Generic drug: Glucagon Inject 1 each into the skin as needed. Please use as needed if your blood sugars are less than 70 and do not improve within 15 minutes of consuming sugar or if you have symptoms of hypoglycemia   insulin aspart 100 UNIT/ML FlexPen Commonly known as: NOVOLOG Inject 4 Units into the skin 3 (three) times daily with meals.   insulin glargine 100 UNIT/ML injection Commonly known as: LANTUS Inject 0.18 mLs (18 Units total) into the skin daily. Start taking on: May 13, 2023   Lancet Device Misc 1 each by Does not apply route in the morning, at noon, and at bedtime. May substitute to any manufacturer covered by patient's insurance.   Lancets Misc. Misc 1 each by Does not apply route in the morning, at noon, and at bedtime. May substitute to any manufacturer covered by patient's insurance.   Lo Loestrin Fe 1 MG-10 MCG / 10 MCG tablet Generic drug: Norethindrone-Ethinyl Estradiol-Fe Biphas Take 1 tablet by mouth daily.   multivitamin with minerals Tabs tablet Take 1  tablet by mouth daily.   nystatin 100000 UNIT/ML suspension Commonly known as: MYCOSTATIN Take 5 mLs (500,000 Units total) by mouth 4 (four) times daily for 4 days.   Pen Needles 32G X 4 MM Misc Please replace the insulin pen needles as instructed: Replace the needle each time you inject insulin        Disposition and follow-up:   Ms.Trudee Kazlauskas was discharged from [facility] in Good condition.  At the hospital follow up visit please address:  1.  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  Labs / imaging needed at time of follow-up: Urine ACR, BMP  3.  Pending labs/ test needing follow-up: GAD-65 AB and IA-2 autoantibodies   4.  Medication Changes  STOPPED  -N/A    ADDED  -Blood Glucose Monitoring Suppl Devi -BLOOD GLUCOSE TEST STRIPS Strp -Dexcom G7 Receiver Devi -Dexcom G7 Sensor Misc -Gvoke Kit 1 MG/0.2ML Soln -insulin aspart 100 UNIT/ML FlexPen 4 units TID - insulin glargine 100 UNIT/ML injection 18 units  -Lancet Device Misc -nystatin 100000 UNIT/ML suspension -Pen Needles  32G X 4 MM Misc.    MODIFIED  -N/A     Follow-up Appointments:  Follow-up Information     Alveria Apley, NP Follow up.   Specialty: Family Medicine Why: TIME : 8:20 AM DATE : MARCH 17 , 2025  PLEASE BRINB ALL MEDICATION, ID and INS CARD Contact information: [address] [place] [address] [phone]         Lovie Macadamia, MD. Go on 05/31/2023.   Specialty: Internal Medicine Why: March 19th at 8:45 Contact information: [address] [place] [address] [phone]          Plyler, Cecil Cranker, RD Follow up on 05/31/2023.   Specialty: Dietician Why: Macrh 19th at 8:15 in the morning Contact information: [address] [place] [address] [phone]                 Hospital Course by problem list: Diabetic ketoacidosis  Patient presented with acidosis with pH on VBG 7.154, bicarbonate 6, beta hydroxybutyric acid >8. She was started on endo-tool and was transitioned to subcutaneous insulin. Patient was discharged on 18 units of Lantus and 4 units TID with meals. C-peptide was 0.4. She has family history of T1 and T2DM, please follow up GAD-65 AB and IA-2 Autoantibodies.    Hyperkalemia Mild and in the setting of DKA. This resolved without interventions.    Acute kidney injury Likely in setting of dehydration from N/V, polyuria. Resolved after IVF  Leukocytosis Thrombocytosis Likely in setting of acute inflammation/illness. Resolved.   Hypercalcemia Resolved with IVF.    Throat pain Group A strep test negative. She does have what appears to be white exudate over the posterior tongue and oropharynx which is painful. Concern for oropharyngeal candida in the setting of uncontrolled diabetes. We started her on a nystatin swish and swallow 500,000 units four times daily for 7 day course    Discharge Subjective: Patient is feeling well. She was able to give herself insulin without difficulty. She denies abdominal pain or nausea. She is ready to go home to her baby. She denies gestational diabetes   Discharge Exam:   Blood pressure (!) 100/56, pulse 67, temperature 97.8 F (36.6 C), temperature source Oral, resp. rate 18, height 5\' 2"  (1.575 m), weight 57.8 kg, SpO2 100%, not currently breastfeeding.  Constitutional:Well-appearing , sitting in bed, in no acute distress Cardiovascular: regular rate and rhythm, no m/r/g Pulmonary/Chest: normal work of breathing on room air, lungs clear to auscultation bilaterally. No crackles  Abdominal: soft,  non-tender, non-distended.  Neurological: alert & oriented x 3 Skin: warm and dry Psych: Normal mood and affect  Pertinent Labs, Studies, and Procedures:     Latest Ref Rng & Units 05/12/2023    8:12 AM 05/11/2023    4:25 AM 05/10/2023    2:19 PM  CBC  WBC 4.0 - 10.5 K/uL 6.5  11.9    Hemoglobin 12.0 - 15.0 g/dL 24.4  01.0  27.2   Hematocrit 36.0 - 46.0 % 34.6  35.9  39.0   Platelets 150 - 400 K/uL 289  370         Latest Ref Rng & Units 05/12/2023    8:12 AM 05/11/2023    2:49 PM 05/11/2023   10:04 AM  CMP  Glucose 70 - 99 mg/dL 536  644  034   BUN 6 - 20 mg/dL 14  15  15    Creatinine 0.44 - 1.00 mg/dL 7.42  5.95  6.38  Sodium 135 - 145 mmol/L 133  134  135   Potassium 3.5 - 5.1 mmol/L 3.7  3.9  3.5   Chloride 98 - 111 mmol/L 102  104  105   CO2 22 - 32 mmol/L 20  17  16    Calcium 8.9 - 10.3 mg/dL 8.8  9.4  9.2     DG Chest Portable 1 View Result Date: 05/10/2023 CLINICAL DATA:  Chest pain EXAM: PORTABLE CHEST 1 VIEW COMPARISON:  02/07/2019 FINDINGS: The heart size and mediastinal contours are within normal limits. Both lungs are clear. The visualized skeletal structures are unremarkable. IMPRESSION: No active disease. Electronically Signed   By: Judie Petit.  Shick M.D.   On: 05/10/2023 08:40     Discharge Instructions: Discharge Instructions     Ambulatory referral to Nutrition and Diabetic Education   Complete by: As directed    New Onset DM1   Call MD for:  difficulty breathing, headache or visual disturbances   Complete by: As directed    Call MD for:  extreme fatigue   Complete by: As directed    Call MD for:  hives   Complete by: As directed    Call MD for:  persistant dizziness or light-headedness   Complete by: As directed    Call MD for:  persistant nausea and vomiting   Complete by: As directed    Call MD for:  redness, tenderness, or signs of infection (pain, swelling, redness, odor or green/yellow discharge around incision site)   Complete by: As directed     Call MD for:  severe uncontrolled pain   Complete by: As directed    Call MD for:  temperature >100.4   Complete by: As directed    Diet - low sodium heart healthy   Complete by: As directed    Discharge instructions   Complete by: As directed    You came to the hospital for diabetes ketoacidosis.  We treated you with insulin.    *For your Diabetes  -We have started you on these following medications:  -Lantus, this is a long acting insulin that you will inject 18 units every morning (once a day)  -Novolog (short acting with meals), inject 4 units with every meal (three times a day)  -Please check your blood sugar with your free style libre, if you blood sugars are less than 70, please call the phone number provided below  -We have set you up with our internal medicine clinic: Your appointment is at 9:45 on March 19th -We have also set you an appointment with our diabetes coordinator on March 19th at 8:15 in the morning, these are located in the same office   If you have any questions or concerns please feel free to call: Internal medicine clinic at [phone]  *Please use Nystatin 4 times a day until March 4th   If you have any of these following symptoms, please call us or seek care at an emergency department: -Chest Pain -Difficulty Breathing -Syncope (passing out) -Drooping of face -Slurred speech -Sudden weakness in your leg or arm -Fever -Chills -Blood sugars less than 70    We are glad that you are feeling better, it was a pleasure to care for you!  Faith Rogue DO   Increase activity slowly   Complete by: As directed        Signed: Faith Rogue DO [facility] - PGY1 Pager: [phone] 05/12/2023, 11:13 AM    Please contact the on call pager  after 5 pm and on weekends at [phone].

## 2023-05-12 NOTE — Inpatient Diabetes Management (Addendum)
 Inpatient Diabetes Program Recommendations  AACE/ADA: New Consensus Statement on Inpatient Glycemic Control (2015)  Target Ranges:  Prepandial:   less than 140 mg/dL      Peak postprandial:   less than 180 mg/dL (1-2 hours)      Critically ill patients:  140 - 180 mg/dL   Lab Results  Component Value Date   GLUCAP 296 (H) 05/12/2023   HGBA1C 10.8 (H) 05/10/2023    Review of Glycemic Control  Diabetes history: New onset DM1(does not make insulin.  Needs correction, basal and meal coverage)  Outpatient Diabetes medications: None  Current orders for Inpatient glycemic control: Lantus 18 units every day, Novolog 0-9 units TID and 0-5 units QHS  Inpatient Diabetes Program Recommendations:    Please consider,  Novolog 4 units TID with meals if she consumes at least 50%.  Noted Lantus increased from 15 units every day to 18 units every day today.    Will continue to follow while inpatient.  Thank you, Dulce Sellar, MSN, CDCES Diabetes Coordinator Inpatient Diabetes Program [phone] (team pager from 8a-5p)

## 2023-05-13 LAB — GLUTAMIC ACID DECARBOXYLASE AUTO ABS: Glutamic Acid Decarb Ab: 5768.5 U/mL — ABNORMAL HIGH (ref 0.0–5.0)

## 2023-05-20 LAB — IA-2 AUTOANTIBODIES: IA-2 Autoantibodies: <7.5 U/mL

## 2023-05-29 ENCOUNTER — Ambulatory Visit: Payer: Medicaid Other | Admitting: Family Medicine

## 2023-05-31 ENCOUNTER — Encounter: Payer: Medicaid Other | Admitting: Dietician

## 2023-05-31 ENCOUNTER — Encounter: Payer: Medicaid Other | Admitting: Student

## 2023-06-26 ENCOUNTER — Other Ambulatory Visit: Payer: Self-pay | Admitting: Student

## 2023-06-26 ENCOUNTER — Other Ambulatory Visit: Payer: Self-pay

## 2023-06-26 ENCOUNTER — Encounter (HOSPITAL_COMMUNITY): Payer: Self-pay

## 2023-06-26 MED ORDER — CVS GLUCOSE METER TEST STRIPS VI STRP: ORAL_STRIP | 12 refills | Status: AC

## 2023-06-26 NOTE — Telephone Encounter (Signed)
 NOT [facility] PATIENT

## 2023-06-26 NOTE — Telephone Encounter (Signed)
 I called the patient x3, unable to reach the patient. Unable to lvm.

## 2023-06-26 NOTE — Progress Notes (Signed)
 Patient has yet to establish care with PCP. She was hospitalized in February with DKA under our service and is requesting additional test strips. Attempted contact with patient for establishing care. Will fill this script one time for test strips.

## 2023-07-10 ENCOUNTER — Other Ambulatory Visit: Payer: Self-pay

## 2023-08-31 ENCOUNTER — Other Ambulatory Visit (HOSPITAL_COMMUNITY): Payer: Self-pay

## 2023-08-31 ENCOUNTER — Encounter (HOSPITAL_COMMUNITY): Payer: Self-pay

## 2023-10-18 ENCOUNTER — Emergency Department (HOSPITAL_COMMUNITY)
Admission: EM | Admit: 2023-10-18 | Discharge: 2023-10-18 | Disposition: A | Discharge status: Home / Self Care | Attending: Emergency Medicine | Admitting: Emergency Medicine

## 2023-10-18 ENCOUNTER — Other Ambulatory Visit (HOSPITAL_COMMUNITY): Payer: Self-pay

## 2023-10-18 ENCOUNTER — Encounter (HOSPITAL_COMMUNITY): Payer: Self-pay

## 2023-10-18 ENCOUNTER — Other Ambulatory Visit: Payer: Self-pay

## 2023-10-18 DIAGNOSIS — R739 Hyperglycemia, unspecified: Secondary | ICD-10-CM | POA: Diagnosis present

## 2023-10-18 DIAGNOSIS — E1065 Type 1 diabetes mellitus with hyperglycemia: Secondary | ICD-10-CM | POA: Insufficient documentation

## 2023-10-18 HISTORY — DX: Type 2 diabetes mellitus without complications: E11.9

## 2023-10-18 LAB — I-STAT CHEM 8, ED
BUN: 11 mg/dL (ref 6–20)
Calcium, Ion: 1.12 mmol/L — ABNORMAL LOW (ref 1.15–1.40)
Chloride: 96 mmol/L — ABNORMAL LOW (ref 98–111)
Creatinine, Ser: 0.8 mg/dL (ref 0.44–1.00)
Glucose, Bld: 418 mg/dL — ABNORMAL HIGH (ref 70–99)
HCT: 42 % (ref 36.0–46.0)
Hemoglobin: 14.3 g/dL (ref 12.0–15.0)
Potassium: 3.9 mmol/L (ref 3.5–5.1)
Sodium: 134 mmol/L — ABNORMAL LOW (ref 135–145)
TCO2: 25 mmol/L (ref 22–32)

## 2023-10-18 LAB — CBC
HCT: 39.3 % (ref 36.0–46.0)
Hemoglobin: 13 g/dL (ref 12.0–15.0)
MCH: 28.4 pg (ref 26.0–34.0)
MCHC: 33.1 g/dL (ref 30.0–36.0)
MCV: 85.8 fL (ref 80.0–100.0)
Platelets: 349 K/uL (ref 150–400)
RBC: 4.58 MIL/uL (ref 3.87–5.11)
RDW: 15.2 % (ref 11.5–15.5)
WBC: 10.2 K/uL (ref 4.0–10.5)
nRBC: 0 % (ref 0.0–0.2)

## 2023-10-18 LAB — COMPREHENSIVE METABOLIC PANEL WITH GFR
ALT: 23 U/L (ref 0–44)
AST: 17 U/L (ref 15–41)
Albumin: 3.9 g/dL (ref 3.5–5.0)
Alkaline Phosphatase: 103 U/L (ref 38–126)
Anion gap: 15 (ref 5–15)
BUN: 12 mg/dL (ref 6–20)
CO2: 23 mmol/L (ref 22–32)
Calcium: 9.6 mg/dL (ref 8.9–10.3)
Chloride: 93 mmol/L — ABNORMAL LOW (ref 98–111)
Creatinine, Ser: 0.91 mg/dL (ref 0.44–1.00)
GFR, Estimated: >60 mL/min (ref >60)
Glucose, Bld: 395 mg/dL — ABNORMAL HIGH (ref 70–99)
Potassium: 3.9 mmol/L (ref 3.5–5.1)
Sodium: 131 mmol/L — ABNORMAL LOW (ref 135–145)
Total Bilirubin: 0.8 mg/dL (ref 0.0–1.2)
Total Protein: 7.8 g/dL (ref 6.5–8.1)

## 2023-10-18 LAB — I-STAT VENOUS BLOOD GAS, ED
Acid-Base Excess: 3 mmol/L — ABNORMAL HIGH (ref 0.0–2.0)
Bicarbonate: 27 mmol/L (ref 20.0–28.0)
Calcium, Ion: 1.12 mmol/L — ABNORMAL LOW (ref 1.15–1.40)
HCT: 40 % (ref 36.0–46.0)
Hemoglobin: 13.6 g/dL (ref 12.0–15.0)
O2 Saturation: 100 %
Potassium: 4 mmol/L (ref 3.5–5.1)
Sodium: 133 mmol/L — ABNORMAL LOW (ref 135–145)
TCO2: 28 mmol/L (ref 22–32)
pCO2, Ven: 39.6 mmHg — ABNORMAL LOW (ref 44–60)
pH, Ven: 7.442 — ABNORMAL HIGH (ref 7.25–7.43)
pO2, Ven: 177 mmHg — ABNORMAL HIGH (ref 32–45)

## 2023-10-18 LAB — HCG, SERUM, QUALITATIVE: Preg, Serum: NEGATIVE

## 2023-10-18 LAB — CBG MONITORING, ED
Glucose-Capillary: 377 mg/dL — ABNORMAL HIGH (ref 70–99)
Glucose-Capillary: 265 mg/dL — ABNORMAL HIGH (ref 70–99)

## 2023-10-18 MED ORDER — SODIUM CHLORIDE 0.9 % IV BOLUS
1000.0000 mL | Freq: Once | INTRAVENOUS | Status: AC
  Administered 2023-10-18: 1000 mL via INTRAVENOUS

## 2023-10-18 MED ORDER — BLOOD GLUCOSE TEST VI STRP
1.0000 | ORAL_STRIP | Freq: Three times a day (TID) | 0 refills | Status: AC
Start: 2023-10-18 — End: 2023-11-21
  Filled 2023-10-18: qty 100, 34d supply, fill #0

## 2023-10-18 MED ORDER — DEXCOM G7 SENSOR MISC
10 refills | Status: AC
  Filled 2023-10-18: qty 3, 30d supply, fill #0

## 2023-10-18 MED ORDER — BLOOD GLUCOSE MONITOR SYSTEM W/DEVICE KIT
1.0000 | PACK | Freq: Three times a day (TID) | 0 refills | Status: AC
  Filled 2023-10-18: qty 1, 30d supply, fill #0

## 2023-10-18 MED ORDER — PEN NEEDLES 32G X 4 MM MISC
0 refills | Status: AC
  Filled 2023-10-18: qty 100, 30d supply, fill #0

## 2023-10-18 MED ORDER — INSULIN GLARGINE 100 UNIT/ML SOLOSTAR PEN
18.0000 [IU] | PEN_INJECTOR | Freq: Every day | SUBCUTANEOUS | 11 refills | Status: AC
  Filled 2023-10-18: qty 9, 50d supply, fill #0

## 2023-10-18 MED ORDER — INSULIN ASPART 100 UNIT/ML FLEXPEN
4.0000 [IU] | PEN_INJECTOR | Freq: Three times a day (TID) | SUBCUTANEOUS | 0 refills | Status: AC
  Filled 2023-10-18: qty 15, 30d supply, fill #0

## 2023-10-18 NOTE — Discharge Summary (Signed)
 RNCM consulted in regards to medication assistance.  Pt has Medicaid insurance coverage and is not eligible for Medication Assistance Through [facility]) program. RNCM suggests sending Rx to [facility] at  [facility]  to fill and bring to pt at bedside prior to discharge.    RNCM spoke to her about switching her pharmacy to [facility] OP pharmacy and they will deliver to her home monthly.     RNCM also consulted regarding PCP establishment. Pt presented to [facility] ED today with hyperglycemia. RNCM met with pt at bedside; pt confirms not having access to follow up care with PCP . Discussed with patient importance and benefits of establishing PCP, and not utilizing the ED for primary care needs. Pt verbalized understanding and is in agreement. Discussed other options, provided list of PCPs accepting new pts. RNCM obtained appointment on December 12, 2023  with  Rosaline Bohr, NP and placed on After Visit Summary paperwork.  No further case management needs communicated at this time. Marisa Long, BSN,  RN, UTAH [phone]

## 2023-10-18 NOTE — Inpatient Diabetes Management (Signed)
 Inpatient Diabetes Program Recommendations  AACE/ADA: New Consensus Statement on Inpatient Glycemic Control (2015)  Target Ranges:  Prepandial:   less than 140 mg/dL      Peak postprandial:   less than 180 mg/dL (1-2 hours)      Critically ill patients:  140 - 180 mg/dL   Lab Results  Component Value Date   GLUCAP 377 (H) 10/18/2023   HGBA1C 10.8 (H) 05/10/2023    Review of Glycemic Control  Diabetes history: New Diabetes type 1 diagnosed 04/2023 Outpatient Diabetes medications: Prescribed Dexcom G7 (refilled 4/14), Lantus  18 units Daily, Novolog  4 units tid Current orders for Inpatient glycemic control:  Being evaluated in the ED  We saw pt in February this year with new DM type 1 diagnosis with a c-peptide level that was low at 0.4. Pt was suppose to follow up at Internal medicine clinic and was also scheduled with Arland Hole, CDE for Diabetes Education. Pt did not attend these scheduled appointments. Therefore did not have refills after hospitalization.  Insulins were $4 and CGM was $0. Will revisit pt this admission.  Thanks,  Clotilda Bull RN, MSN, BC-ADM Inpatient Diabetes Coordinator Team Pager [phone] (8a-5p)

## 2023-10-18 NOTE — Discharge Instructions (Addendum)
 Please be on time! If a cancellation is unavoidable, please cancel your visit at least 24 hours in advance so that we may give this time to another patient. Please feel free to bring a support person with you to your appointment. Please note that your initial appointment will be to establish care, review medical history, medications/refills, current health concern, or chronic medical conditions (such as high blood pressure, cholesterol, thyroid disease, diabetes, etc.)  Please remember to bring the following to your appointment:  Current Insurance Card(s)  All medications & a list of medications  Driver's License or Valid ID  To speed up Registration, sign up for [employer] and E-Checkin for your visit! MyChart - Login Page  Helpful information:  If your insurance requires a copay, you will be asked upon checking in.  Please arrive 15 minutes prior to your scheduled appointment time.   Please contact us  if you have any questions.  Contact a health care provider if: You have diabetes and have trouble keeping your blood sugar in the right range. Your blood sugar is at or above 240 mg/dL (86.6 mmol/L) for 2 days in a row. You have high blood sugar often. You have signs of illness, such as: Nausea or vomiting. A headache. A fever. You can't stop vomiting. Get help right away if: Your blood sugar monitor reads high even when you're taking insulin . You have trouble breathing. These symptoms may be an emergenc

## 2023-10-18 NOTE — Care Management (Addendum)
 ED RNCM retrieved medications from [facility] pharmacy and delivered them to the patient at bedside. Provided education regarding future refills: when ready, the patient may call [phone] [facility]) to request medication delivery by confirming her address. RNCM asked if the patient had any questions; patient verbalized no questions or concerns at this time. Updated EDP.  Albert Gosling RN, BSN CNOR ED RN Care Manager [phone]

## 2023-10-18 NOTE — ED Triage Notes (Signed)
 Pt bib ems from home;diagnosed with DM x 4 months ago, does not have access to meds regularly; pt states glucometer at home read hi today; endorses increased thirst, urination, fatigue; states she has lost 5-7 lbs in last 2 days; ems glucometer reading 529; pt states she has taken insulin  today but has not eaten; denies pain; 22 ga RH, 99.36F, 122/72, P 74, RR 14, sats 100%

## 2023-10-18 NOTE — ED Provider Notes (Signed)
 [place] EMERGENCY DEPARTMENT AT [facility] Provider Note   CSN: [id number] Arrival date & time: 10/18/23  1352     Patient presents with: Hyperglycemia   Marisa Long is a 23 y.o. female who presents emergency department for high blood sugar.  Patient was diagnosed with type 1 diabetes this year presenting with diabetic ketoacidosis in February 2025.  She reports that she has been having a lot of trouble obtaining a primary care doctor.  She was able to get a refill on her long-acting insulin  and short acting insulin  and was only out of her long-acting for about 1 week.  She reports that she has been taking her medications as directed over the past week has had increased urinary frequency and voluminous urine along with increased thirst and fatigue.  She was afraid that she might get DKA again and so has been decreasing her oral intake.  She is lost a significant amount of weight and states that she is only been drinking water  for the past 2 days because she was hoping to keep her blood sugars down.  She reports that she is hungry but is afraid to make her blood sugar go up.  She denies any fever nausea or vomiting.  She denies burning with urination.    Hyperglycemia      Prior to Admission medications   Medication Sig Start Date End Date Taking? Authorizing Provider  Blood Glucose Monitoring Suppl (BLOOD GLUCOSE MONITOR SYSTEM) w/Device KIT 1 each by Does not apply route in the morning, at noon, and at bedtime. May substitute to any manufacturer covered by patient's insurance. 10/18/23   Takaya Hyslop, PA-C  Continuous Glucose Sensor (DEXCOM G7 SENSOR) MISC Please replace the skin sensor every 10 days 10/18/23   Abednego Yeates, PA-C  Glucagon  (GVOKE HYPOPEN  2-PACK) 1 MG/0.2ML SOAJ Inject 1 into the skin as needed. Please use as needed if your blood sugars are less than 70 and do not improve within 15 minutes of consuming sugar or if you have symptoms of hypoglycemia 05/12/23    Kandis Perkins, DO  Glucose Blood (BLOOD GLUCOSE TEST STRIPS) STRP Use in the morning, at noon, and at bedtime. May substitute to any manufacturer covered by patient's insurance. 10/18/23 11/17/23  Doreatha Offer, Lavanda, PA-C  glucose blood ([employer] GLUCOSE METER TEST STRIPS) test strip Use as instructed. You need to establish care with a PCP for additional refills. 06/26/23   Kandis Perkins, DO  insulin  aspart (NOVOLOG ) 100 UNIT/ML FlexPen Inject 4 Units into the skin 3 (three) times daily with meals. 10/18/23   Nillie Bartolotta, PA-C  insulin  glargine (LANTUS ) 100 UNIT/ML Solostar Pen Inject 18 Units into the skin daily. 10/18/23   Renly Roots, PA-C  Insulin  Pen Needle (PEN NEEDLES) 32G X 4 MM MISC Please replace the insulin  pen needles as instructed: Replace the needle each time you inject insulin  10/18/23   Blossie Raffel, PA-C  LO LOESTRIN FE  1 MG-10 MCG / 10 MCG tablet Take 1 tablet by mouth daily. Patient not taking: Reported on 05/10/2023 11/01/22   Ervin, Michael L, MD  Multiple Vitamin (MULTIVITAMIN WITH MINERALS) TABS tablet Take 1 tablet by mouth daily.    [provider]    Allergies: Patient has no known allergies.    Review of Systems  Updated Vital Signs BP (!) 105/51   Pulse 71   Temp 98.2 F (36.8 C)   Resp 17   SpO2 100%   Physical Exam Vitals and nursing note reviewed.  Constitutional:  General: She is not in acute distress.    Appearance: She is well-developed. She is not diaphoretic.  HENT:     Head: Normocephalic and atraumatic.     Right Ear: External ear normal.     Left Ear: External ear normal.     Nose: Nose normal.     Mouth/Throat:     Mouth: Mucous membranes are moist.  Eyes:     General: No scleral icterus.    Conjunctiva/sclera: Conjunctivae normal.  Cardiovascular:     Rate and Rhythm: Normal rate and regular rhythm.     Heart sounds: Normal heart sounds. No murmur heard.    No friction rub. No gallop.  Pulmonary:     Effort: Pulmonary effort is  normal. No respiratory distress.     Breath sounds: Normal breath sounds.  Abdominal:     General: Bowel sounds are normal. There is no distension.     Palpations: Abdomen is soft. There is no mass.     Tenderness: There is no abdominal tenderness. There is no guarding.  Musculoskeletal:     Cervical back: Normal range of motion.  Skin:    General: Skin is warm and dry.  Neurological:     Mental Status: She is alert and oriented to person, place, and time.  Psychiatric:        Behavior: Behavior normal.     (all labs ordered are listed, but only abnormal results are displayed) Labs Reviewed  CBG MONITORING, ED - Abnormal; Notable for the following components:      Result Value   Glucose-Capillary 377 (*)    All other components within normal limits  I-STAT VENOUS BLOOD GAS, ED - Abnormal; Notable for the following components:   pH, Ven 7.442 (*)    pCO2, Ven 39.6 (*)    pO2, Ven 177 (*)    Acid-Base Excess 3.0 (*)    Sodium 133 (*)    Calcium, Ion 1.12 (*)    All other components within normal limits  I-STAT CHEM 8, ED - Abnormal; Notable for the following components:   Sodium 134 (*)    Chloride 96 (*)    Glucose, Bld 418 (*)    Calcium, Ion 1.12 (*)    All other components within normal limits  CBC  URINALYSIS, ROUTINE W REFLEX MICROSCOPIC  HCG, SERUM, QUALITATIVE  COMPREHENSIVE METABOLIC PANEL WITH GFR  CBG MONITORING, ED    EKG: None  Radiology: No results found.   Procedures   Medications Ordered in the ED  sodium chloride  0.9 % bolus 1,000 mL (1,000 mLs Intravenous New Bag/Given 10/18/23 1511)    Clinical Course as of 10/18/23 1545  Wed Oct 18, 2023  1429 I-Stat venous blood gas, ([facility] ED, [facility], [facility])(!) [AH]  1430 pH, Ven(!): 7.442 [AH]  1430 pO2, Ven(!): 177 [AH]  1430 pCO2, Ven(!): 39.6 Respiratory alkalosis [AH]    Clinical Course User Index [AH] Arloa Chroman, PA-C                                 Medical Decision Making Amount and/or  Complexity of Data Reviewed Labs: ordered. Decision-making details documented in ED Course.  Risk OTC drugs. Prescription drug management.  23 year old female with fairly new diagnosis of type 1 diabetes who has been having significant difficulty accessing primary care. Patient has been having more frequent urination and thirst weight loss.  She been afraid to eat because  she is afraid she will run her blood sugars high.   On exam patient is not having any abdominal tenderness nausea or vomiting.  I ordered labs and reviewed them.  She does have elevated blood glucose which significantly improved with fluid resuscitation.  No anion gap suggestive of DKA. His VBG showed respiratory acidosis likely due to some anxiety earlier upon arrival. Pregnancy test negative. Case discussed with transition of care nurse manager who is help the patient obtain her medications and she has gotten an outpatient follow-up on 30 September at [facility] center.  Discussed reasons to seek immediate medical care.  Patient will go home with her medications and hand.  She does not appear to have any acute medical emergencies.     Final diagnoses:  None    ED Discharge Orders          Ordered    Continuous Glucose Sensor (DEXCOM G7 SENSOR) MISC        10/18/23 1458    Blood Glucose Monitoring Suppl (BLOOD GLUCOSE MONITOR SYSTEM) w/Device KIT  3 times daily        10/18/23 1458    Glucose Blood (BLOOD GLUCOSE TEST STRIPS) STRP  3 times daily        10/18/23 1458    insulin  aspart (NOVOLOG ) 100 UNIT/ML FlexPen  3 times daily with meals        10/18/23 1458    insulin  glargine (LANTUS ) 100 UNIT/ML Solostar Pen  Daily       Note to Pharmacy: change to pens from vial   10/18/23 1458    Insulin  Pen Needle (PEN NEEDLES) 32G X 4 MM MISC        10/18/23 1458               Arloa Chroman, PA-C 10/18/23 2256    Tegeler, Lonni PARAS, MD 10/19/23 [phone]

## 2023-11-30 ENCOUNTER — Ambulatory Visit: Admitting: Obstetrics and Gynecology

## 2023-12-12 ENCOUNTER — Ambulatory Visit (INDEPENDENT_AMBULATORY_CARE_PROVIDER_SITE_OTHER): Admitting: Primary Care

## 2024-01-11 ENCOUNTER — Other Ambulatory Visit: Payer: Self-pay
